# Patient Record
Sex: Female | Born: 1950
Health system: Southern US, Community
[De-identification: ages and names within clinical notes are randomized; demographics above are authoritative.]

## PROBLEM LIST (undated history)

## (undated) DIAGNOSIS — R002 Palpitations: Secondary | ICD-10-CM

## (undated) DIAGNOSIS — Z780 Asymptomatic menopausal state: Secondary | ICD-10-CM

## (undated) DIAGNOSIS — B009 Herpesviral infection, unspecified: Secondary | ICD-10-CM

## (undated) DIAGNOSIS — T7840XA Allergy, unspecified, initial encounter: Secondary | ICD-10-CM

## (undated) DIAGNOSIS — E119 Type 2 diabetes mellitus without complications: Secondary | ICD-10-CM

## (undated) DIAGNOSIS — K219 Gastro-esophageal reflux disease without esophagitis: Secondary | ICD-10-CM

## (undated) DIAGNOSIS — R7309 Other abnormal glucose: Secondary | ICD-10-CM

## (undated) DIAGNOSIS — M8580 Other specified disorders of bone density and structure, unspecified site: Secondary | ICD-10-CM

## (undated) HISTORY — DX: Gastro-esophageal reflux disease without esophagitis: K21.9

## (undated) HISTORY — DX: Herpesviral infection, unspecified: B00.9

## (undated) HISTORY — DX: Allergy, unspecified, initial encounter: T78.40XA

## (undated) HISTORY — DX: Palpitations: R00.2

## (undated) HISTORY — DX: Type 2 diabetes mellitus without complications: E11.9

## (undated) HISTORY — DX: Other abnormal glucose: R73.09

## (undated) HISTORY — DX: Asymptomatic menopausal state: Z78.0

## (undated) HISTORY — DX: Other specified disorders of bone density and structure, unspecified site: M85.80

## (undated) HISTORY — PX: CARDIAC ELECTROPHYSIOLOGY MAPPING AND ABLATION: SHX1292

## (undated) HISTORY — PX: COLONOSCOPY W/ POLYPECTOMY: SHX1380

---

## 2000-06-23 HISTORY — PX: OTHER SURGICAL HISTORY: SHX169

## 2000-06-23 LAB — HM COLONOSCOPY: HM Colonoscopy: NORMAL

## 2003-06-24 DIAGNOSIS — Z780 Asymptomatic menopausal state: Secondary | ICD-10-CM

## 2003-06-24 HISTORY — DX: Asymptomatic menopausal state: Z78.0

## 2005-11-27 ENCOUNTER — Encounter: Payer: Self-pay | Admitting: Internal Medicine

## 2006-04-15 ENCOUNTER — Ambulatory Visit: Payer: Self-pay | Admitting: Family Medicine

## 2006-05-01 ENCOUNTER — Ambulatory Visit: Payer: Self-pay | Admitting: Family Medicine

## 2006-05-01 LAB — CONVERTED CEMR LAB
ALT: 28 units/L (ref 0–40)
AST: 25 units/L (ref 0–37)
Chol/HDL Ratio, serum: 2.8
Cholesterol: 155 mg/dL (ref 0–200)
HDL: 55.2 mg/dL (ref >39.0)
LDL Cholesterol: 91 mg/dL (ref 0–99)
Triglyceride fasting, serum: 43 mg/dL (ref 0–149)
VLDL: 9 mg/dL (ref 0–40)

## 2006-06-10 ENCOUNTER — Ambulatory Visit: Payer: Self-pay | Admitting: Family Medicine

## 2006-07-20 ENCOUNTER — Ambulatory Visit: Payer: Self-pay | Admitting: Family Medicine

## 2006-08-12 ENCOUNTER — Ambulatory Visit: Payer: Self-pay | Admitting: Internal Medicine

## 2006-11-22 LAB — CONVERTED CEMR LAB: Pap Smear: NORMAL

## 2006-11-23 DIAGNOSIS — E785 Hyperlipidemia, unspecified: Secondary | ICD-10-CM | POA: Insufficient documentation

## 2006-11-26 ENCOUNTER — Other Ambulatory Visit: Admission: RE | Admit: 2006-11-26 | Discharge: 2006-11-26 | Payer: Self-pay | Admitting: Family Medicine

## 2006-11-26 ENCOUNTER — Ambulatory Visit: Payer: Self-pay | Admitting: Family Medicine

## 2006-11-26 ENCOUNTER — Encounter (INDEPENDENT_AMBULATORY_CARE_PROVIDER_SITE_OTHER): Payer: Self-pay | Admitting: Family Medicine

## 2006-11-26 DIAGNOSIS — B009 Herpesviral infection, unspecified: Secondary | ICD-10-CM | POA: Insufficient documentation

## 2006-11-27 ENCOUNTER — Telehealth (INDEPENDENT_AMBULATORY_CARE_PROVIDER_SITE_OTHER): Payer: Self-pay | Admitting: *Deleted

## 2006-11-27 LAB — CONVERTED CEMR LAB
Basophils Absolute: 0 10*3/uL (ref 0.0–0.1)
Basophils Relative: 0.5 % (ref 0.0–1.0)
Cholesterol: 191 mg/dL (ref 0–200)
Eosinophils Absolute: 0.3 10*3/uL (ref 0.0–0.6)
Eosinophils Relative: 4.5 % (ref 0.0–5.0)
Glucose, Bld: 87 mg/dL (ref 70–99)
HCT: 39.1 % (ref 36.0–46.0)
HDL: 62.4 mg/dL (ref >39.0)
Hemoglobin: 13 g/dL (ref 12.0–15.0)
LDL Cholesterol: 116 mg/dL — ABNORMAL HIGH (ref 0–99)
Lymphocytes Relative: 35.2 % (ref 12.0–46.0)
MCHC: 33.2 g/dL (ref 30.0–36.0)
MCV: 77.1 fL — ABNORMAL LOW (ref 78.0–100.0)
Monocytes Absolute: 0.6 10*3/uL (ref 0.2–0.7)
Monocytes Relative: 9.5 % (ref 3.0–11.0)
Neutro Abs: 2.9 10*3/uL (ref 1.4–7.7)
Neutrophils Relative %: 50.3 % (ref 43.0–77.0)
Platelets: 258 10*3/uL (ref 150–400)
RBC: 5.07 M/uL (ref 3.87–5.11)
RDW: 14.3 % (ref 11.5–14.6)
TSH: 1.94 microintl units/mL (ref 0.35–5.50)
Total CHOL/HDL Ratio: 3.1
Triglycerides: 64 mg/dL (ref 0–149)
VLDL: 13 mg/dL (ref 0–40)
WBC: 5.8 10*3/uL (ref 4.5–10.5)

## 2006-12-07 ENCOUNTER — Telehealth (INDEPENDENT_AMBULATORY_CARE_PROVIDER_SITE_OTHER): Payer: Self-pay | Admitting: *Deleted

## 2006-12-21 ENCOUNTER — Encounter: Admission: RE | Admit: 2006-12-21 | Discharge: 2006-12-21 | Payer: Self-pay | Admitting: Family Medicine

## 2007-09-02 ENCOUNTER — Ambulatory Visit: Payer: Self-pay | Admitting: Internal Medicine

## 2007-12-29 ENCOUNTER — Encounter: Admission: RE | Admit: 2007-12-29 | Discharge: 2007-12-29 | Payer: Self-pay | Admitting: Internal Medicine

## 2007-12-29 ENCOUNTER — Encounter: Payer: Self-pay | Admitting: Internal Medicine

## 2007-12-29 ENCOUNTER — Other Ambulatory Visit: Admission: RE | Admit: 2007-12-29 | Discharge: 2007-12-29 | Payer: Self-pay | Admitting: Internal Medicine

## 2007-12-29 ENCOUNTER — Ambulatory Visit: Payer: Self-pay | Admitting: Internal Medicine

## 2007-12-29 ENCOUNTER — Encounter (INDEPENDENT_AMBULATORY_CARE_PROVIDER_SITE_OTHER): Payer: Self-pay | Admitting: *Deleted

## 2007-12-29 DIAGNOSIS — M8580 Other specified disorders of bone density and structure, unspecified site: Secondary | ICD-10-CM | POA: Insufficient documentation

## 2007-12-29 LAB — CONVERTED CEMR LAB: Pap Smear: NORMAL

## 2007-12-30 ENCOUNTER — Encounter (INDEPENDENT_AMBULATORY_CARE_PROVIDER_SITE_OTHER): Payer: Self-pay | Admitting: *Deleted

## 2007-12-31 ENCOUNTER — Encounter (INDEPENDENT_AMBULATORY_CARE_PROVIDER_SITE_OTHER): Payer: Self-pay | Admitting: *Deleted

## 2007-12-31 LAB — CONVERTED CEMR LAB
ALT: 23 units/L (ref 0–35)
AST: 23 units/L (ref 0–37)
BUN: 12 mg/dL (ref 6–23)
Basophils Absolute: 0 10*3/uL (ref 0.0–0.1)
Basophils Relative: 0.5 % (ref 0.0–1.0)
CO2: 28 meq/L (ref 19–32)
Calcium: 9.6 mg/dL (ref 8.4–10.5)
Chloride: 106 meq/L (ref 96–112)
Cholesterol: 208 mg/dL (ref 0–200)
Creatinine, Ser: 0.8 mg/dL (ref 0.4–1.2)
Direct LDL: 132.8 mg/dL
Eosinophils Absolute: 0.2 10*3/uL (ref 0.0–0.7)
Eosinophils Relative: 3.7 % (ref 0.0–5.0)
GFR calc Af Amer: 95 mL/min
GFR calc non Af Amer: 79 mL/min
Glucose, Bld: 91 mg/dL (ref 70–99)
HCT: 40.4 % (ref 36.0–46.0)
HDL: 54.9 mg/dL (ref >39.0)
Hemoglobin: 12.9 g/dL (ref 12.0–15.0)
Lymphocytes Relative: 28 % (ref 12.0–46.0)
MCHC: 32 g/dL (ref 30.0–36.0)
MCV: 79.6 fL (ref 78.0–100.0)
Monocytes Absolute: 0.7 10*3/uL (ref 0.1–1.0)
Monocytes Relative: 10.1 % (ref 3.0–12.0)
Neutro Abs: 3.8 10*3/uL (ref 1.4–7.7)
Neutrophils Relative %: 57.7 % (ref 43.0–77.0)
Platelets: 226 10*3/uL (ref 150–400)
Potassium: 4.4 meq/L (ref 3.5–5.1)
RBC: 5.08 M/uL (ref 3.87–5.11)
RDW: 13.7 % (ref 11.5–14.6)
Sodium: 140 meq/L (ref 135–145)
TSH: 1.19 microintl units/mL (ref 0.35–5.50)
Total CHOL/HDL Ratio: 3.8
Triglycerides: 51 mg/dL (ref 0–149)
VLDL: 10 mg/dL (ref 0–40)
WBC: 6.5 10*3/uL (ref 4.5–10.5)

## 2008-01-18 ENCOUNTER — Encounter (INDEPENDENT_AMBULATORY_CARE_PROVIDER_SITE_OTHER): Payer: Self-pay | Admitting: *Deleted

## 2008-01-24 ENCOUNTER — Encounter (INDEPENDENT_AMBULATORY_CARE_PROVIDER_SITE_OTHER): Payer: Self-pay | Admitting: *Deleted

## 2008-09-05 ENCOUNTER — Telehealth (INDEPENDENT_AMBULATORY_CARE_PROVIDER_SITE_OTHER): Payer: Self-pay | Admitting: *Deleted

## 2008-12-28 ENCOUNTER — Telehealth (INDEPENDENT_AMBULATORY_CARE_PROVIDER_SITE_OTHER): Payer: Self-pay | Admitting: *Deleted

## 2009-01-22 ENCOUNTER — Encounter: Payer: Self-pay | Admitting: Internal Medicine

## 2009-01-23 ENCOUNTER — Other Ambulatory Visit: Admission: RE | Admit: 2009-01-23 | Discharge: 2009-01-23 | Payer: Self-pay | Admitting: Internal Medicine

## 2009-01-23 ENCOUNTER — Ambulatory Visit: Payer: Self-pay | Admitting: Internal Medicine

## 2009-01-23 ENCOUNTER — Encounter: Payer: Self-pay | Admitting: Internal Medicine

## 2009-01-23 DIAGNOSIS — J309 Allergic rhinitis, unspecified: Secondary | ICD-10-CM | POA: Insufficient documentation

## 2009-01-24 ENCOUNTER — Encounter: Payer: Self-pay | Admitting: Internal Medicine

## 2009-01-24 LAB — CONVERTED CEMR LAB
ALT: 26 units/L (ref 0–35)
AST: 25 units/L (ref 0–37)
Albumin: 3.6 g/dL (ref 3.5–5.2)
Alkaline Phosphatase: 39 units/L (ref 39–117)
BUN: 10 mg/dL (ref 6–23)
Basophils Absolute: 0 10*3/uL (ref 0.0–0.1)
Basophils Relative: 0.6 % (ref 0.0–3.0)
Bilirubin, Direct: 0.1 mg/dL (ref 0.0–0.3)
CO2: 25 meq/L (ref 19–32)
Calcium: 9.3 mg/dL (ref 8.4–10.5)
Chloride: 111 meq/L (ref 96–112)
Cholesterol: 213 mg/dL — ABNORMAL HIGH (ref 0–200)
Creatinine, Ser: 0.9 mg/dL (ref 0.4–1.2)
Direct LDL: 140.9 mg/dL
Eosinophils Absolute: 0.2 10*3/uL (ref 0.0–0.7)
Eosinophils Relative: 4.2 % (ref 0.0–5.0)
GFR calc non Af Amer: 82.66 mL/min (ref >60)
Glucose, Bld: 91 mg/dL (ref 70–99)
HCT: 39.8 % (ref 36.0–46.0)
HDL: 59.1 mg/dL (ref >39.00)
Hemoglobin: 12.5 g/dL (ref 12.0–15.0)
Lymphocytes Relative: 39.8 % (ref 12.0–46.0)
Lymphs Abs: 2.1 10*3/uL (ref 0.7–4.0)
MCHC: 31.5 g/dL (ref 30.0–36.0)
MCV: 80.6 fL (ref 78.0–100.0)
Monocytes Absolute: 0.7 10*3/uL (ref 0.1–1.0)
Monocytes Relative: 12.3 % — ABNORMAL HIGH (ref 3.0–12.0)
Neutro Abs: 2.4 10*3/uL (ref 1.4–7.7)
Neutrophils Relative %: 43.1 % (ref 43.0–77.0)
Platelets: 215 10*3/uL (ref 150.0–400.0)
Potassium: 4.2 meq/L (ref 3.5–5.1)
RBC: 4.94 M/uL (ref 3.87–5.11)
RDW: 13.5 % (ref 11.5–14.6)
Sodium: 143 meq/L (ref 135–145)
TSH: 2.47 microintl units/mL (ref 0.35–5.50)
Total Bilirubin: 0.7 mg/dL (ref 0.3–1.2)
Total CHOL/HDL Ratio: 4
Total Protein: 7.5 g/dL (ref 6.0–8.3)
Triglycerides: 49 mg/dL (ref 0.0–149.0)
VLDL: 9.8 mg/dL (ref 0.0–40.0)
WBC: 5.4 10*3/uL (ref 4.5–10.5)

## 2009-02-02 LAB — CONVERTED CEMR LAB: Vit D, 25-Hydroxy: 21 ng/mL — ABNORMAL LOW (ref 30–89)

## 2009-02-06 ENCOUNTER — Encounter: Payer: Self-pay | Admitting: Internal Medicine

## 2009-02-06 ENCOUNTER — Encounter: Admission: RE | Admit: 2009-02-06 | Discharge: 2009-02-06 | Payer: Self-pay | Admitting: Internal Medicine

## 2009-02-06 ENCOUNTER — Encounter (INDEPENDENT_AMBULATORY_CARE_PROVIDER_SITE_OTHER): Payer: Self-pay | Admitting: *Deleted

## 2009-02-14 ENCOUNTER — Encounter: Admission: RE | Admit: 2009-02-14 | Discharge: 2009-02-14 | Payer: Self-pay | Admitting: Internal Medicine

## 2009-02-20 ENCOUNTER — Encounter (INDEPENDENT_AMBULATORY_CARE_PROVIDER_SITE_OTHER): Payer: Self-pay | Admitting: *Deleted

## 2009-07-13 ENCOUNTER — Telehealth (INDEPENDENT_AMBULATORY_CARE_PROVIDER_SITE_OTHER): Payer: Self-pay | Admitting: *Deleted

## 2009-12-03 ENCOUNTER — Encounter: Payer: Self-pay | Admitting: Internal Medicine

## 2010-01-23 ENCOUNTER — Ambulatory Visit: Payer: Self-pay | Admitting: Internal Medicine

## 2010-01-23 ENCOUNTER — Other Ambulatory Visit: Admission: RE | Admit: 2010-01-23 | Discharge: 2010-01-23 | Payer: Self-pay | Admitting: Internal Medicine

## 2010-01-23 DIAGNOSIS — R739 Hyperglycemia, unspecified: Secondary | ICD-10-CM | POA: Insufficient documentation

## 2010-01-23 DIAGNOSIS — R7309 Other abnormal glucose: Secondary | ICD-10-CM

## 2010-01-23 DIAGNOSIS — R7303 Prediabetes: Secondary | ICD-10-CM

## 2010-01-23 HISTORY — DX: Other abnormal glucose: R73.09

## 2010-01-23 LAB — CONVERTED CEMR LAB
Ferritin: 358.6 ng/mL — ABNORMAL HIGH (ref 10.0–291.0)
Hgb A1c MFr Bld: 6.2 % (ref 4.6–6.5)
Iron: 39 ug/dL — ABNORMAL LOW (ref 42–145)
Vit D, 25-Hydroxy: 40 ng/mL (ref 30–89)

## 2010-01-23 LAB — HM PAP SMEAR

## 2010-01-28 LAB — CONVERTED CEMR LAB: Pap Smear: NEGATIVE

## 2010-01-29 LAB — CONVERTED CEMR LAB
ALT: 25 units/L (ref 0–35)
AST: 24 units/L (ref 0–37)
Albumin: 3.7 g/dL (ref 3.5–5.2)
Alkaline Phosphatase: 37 units/L — ABNORMAL LOW (ref 39–117)
BUN: 8 mg/dL (ref 6–23)
Basophils Absolute: 0 10*3/uL (ref 0.0–0.1)
Basophils Relative: 0.6 % (ref 0.0–3.0)
Bilirubin, Direct: 0.1 mg/dL (ref 0.0–0.3)
CO2: 26 meq/L (ref 19–32)
Calcium: 9.6 mg/dL (ref 8.4–10.5)
Chloride: 103 meq/L (ref 96–112)
Cholesterol: 203 mg/dL — ABNORMAL HIGH (ref 0–200)
Creatinine, Ser: 0.8 mg/dL (ref 0.4–1.2)
Direct LDL: 126.5 mg/dL
Eosinophils Absolute: 0.2 10*3/uL (ref 0.0–0.7)
Eosinophils Relative: 3.7 % (ref 0.0–5.0)
GFR calc non Af Amer: 90.45 mL/min (ref >60)
Glucose, Bld: 98 mg/dL (ref 70–99)
HCT: 38.1 % (ref 36.0–46.0)
HDL: 63.9 mg/dL (ref >39.00)
Hemoglobin: 12.3 g/dL (ref 12.0–15.0)
Lymphocytes Relative: 24.4 % (ref 12.0–46.0)
Lymphs Abs: 1.5 10*3/uL (ref 0.7–4.0)
MCHC: 32.2 g/dL (ref 30.0–36.0)
MCV: 79.7 fL (ref 78.0–100.0)
Monocytes Absolute: 0.6 10*3/uL (ref 0.1–1.0)
Monocytes Relative: 10.3 % (ref 3.0–12.0)
Neutro Abs: 3.8 10*3/uL (ref 1.4–7.7)
Neutrophils Relative %: 61 % (ref 43.0–77.0)
Platelets: 232 10*3/uL (ref 150.0–400.0)
Potassium: 4.4 meq/L (ref 3.5–5.1)
RBC: 4.78 M/uL (ref 3.87–5.11)
RDW: 15.5 % — ABNORMAL HIGH (ref 11.5–14.6)
Sodium: 141 meq/L (ref 135–145)
TSH: 1.97 microintl units/mL (ref 0.35–5.50)
Total Bilirubin: 0.5 mg/dL (ref 0.3–1.2)
Total CHOL/HDL Ratio: 3
Total Protein: 7.2 g/dL (ref 6.0–8.3)
Triglycerides: 46 mg/dL (ref 0.0–149.0)
VLDL: 9.2 mg/dL (ref 0.0–40.0)
WBC: 6.2 10*3/uL (ref 4.5–10.5)

## 2010-02-20 ENCOUNTER — Encounter: Admission: RE | Admit: 2010-02-20 | Discharge: 2010-02-20 | Payer: Self-pay | Admitting: Internal Medicine

## 2010-02-20 LAB — HM MAMMOGRAPHY

## 2010-02-21 ENCOUNTER — Telehealth (INDEPENDENT_AMBULATORY_CARE_PROVIDER_SITE_OTHER): Payer: Self-pay | Admitting: *Deleted

## 2010-06-06 ENCOUNTER — Ambulatory Visit: Payer: Self-pay | Admitting: Internal Medicine

## 2010-07-23 NOTE — Letter (Signed)
Summary: Results Follow up Letter  [facility] [place]  [address] [place], [place] [address]   Phone: [phone]  Fax: [phone]    12/31/2007 MRN: [id number]  Alyssa Murray [address] [place], [place]  [address]  Dear Ms. Alyssa Murray,  The following are the results of your recent test(s):  Test         Result    Pap Smear:        Normal _____  Not Normal _____ Comments: ______________________________________________________ Cholesterol: LDL(Bad cholesterol):         Your goal is less than:         HDL (Good cholesterol):       Your goal is more than: Comments:  ______________________________________________________ Mammogram:        Normal ___x__  Not Normal _____ Comments:  __________Next mammogram due in 1 year _________________________________________________________ Hemoccult:        Normal _____  Not normal _______ Comments:    _____________________________________________________________________ Other Tests:  see attached copy of labs and comments  We routinely do not discuss normal results over the telephone.  If you desire a copy of the results, or you have any questions about this information we can discuss them at your next office visit.   Sincerely,

## 2010-07-23 NOTE — Assessment & Plan Note (Signed)
Summary: CPX   Vital Signs:  Patient profile:   60 year old female Height:      66.5 inches Weight:      152 pounds BMI:     24.25 Pulse rate:   88 / minute Pulse rhythm:   regular BP sitting:   118 / 76  (left arm) Cuff size:   large  Vitals Entered By: Army Fossa CMA (January 23, 2010 8:03 AM) CC: CPX: Fasting   History of Present Illness: CPX c/o pain at L trapezoid   Current Medications (verified): 1)  Fosamax 70 Mg  Tabs (Alendronate Sodium) .Marland Kitchen.. 1 By Mouth Qwk 2)  Valtrex 500 Mg  Tabs (Valacyclovir Hcl) .Marland Kitchen.. 1 By Mouth Once Daily Two Times A Day X 3 Days 3)  Ca and Vit D 4)  Mvi .... Daily 5)  Cod Liver Oil  Caps (Cod Liver Oil) 6)  Ferrous Gluconate 240 (27 Fe) Mg Tabs (Ferrous Gluconate) 7)  Ginkgo Biloba 60 Mg Tabs (Ginkgo Biloba) 8)  Garlic-Parsley  Tabs (Garlic-Parsley)  Allergies (verified): No Known Drug Allergies  Past History:  Past Medical History: Reviewed history from 01/23/2009 and no changes required. HSV- L buttocks Osteopenia Cardiac ablation secondary to palpitation done aprox 2000--now Asx Allergic rhinitis menopause (since aprox 2005)  Past Surgical History: Reviewed history from 01/23/2009 and no changes required. 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 Single 2 children , they are in [place] the other in [place] Never Smoked Alcohol use-yes Drug use-no diet-- healthy  exercise-- daily  exercise-- daily  Regular exercise-yes  Review of Systems CV:  Denies chest pain or discomfort, palpitations, and swelling of feet. Resp:  Denies cough and wheezing. GI:  Denies bloody stools, diarrhea, nausea, and vomiting. GU:  does SBE, normal  no vag  d/c or bleed. Psych:  Denies anxiety and depression.  Physical Exam  General:  alert, well-developed, and well-nourished.   Neck:  no masses, no thyromegaly, and normal carotid upstroke.   Breasts:  No mass, nodules, thickening, tenderness, bulging, retraction, inflamation, nipple discharge or skin changes noted.  No axilary LADs Lungs:  normal respiratory effort, no intercostal retractions, no accessory muscle use, and normal breath sounds.   Heart:  normal rate, regular rhythm, and no murmur.   Abdomen:  soft, non-tender, no distention, no masses, no guarding, and no rigidity.   Genitalia:  normal introitus, no external lesions, no vaginal discharge, mucosa pink and moist, no vaginal or cervical lesions, no vaginal atrophy, no friaility or hemorrhage, normal uterus size and position, and no adnexal masses or tenderness.   Extremities:  no pretibial edema bilaterally  Psych:  Oriented X3, memory intact for recent and remote, normally interactive, good eye contact, not anxious appearing, and not depressed appearing.     Impression & Recommendations:  Problem # 1:  HEALTH SCREENING (ICD-V70.0) Td 2008 dieting better, exercising, loosing wt ---> praised   Colonoscopy History:  06/23/2000, in [place] as a part of her health plan (had no sx) , reportedly normal hemocult neg today, no FH colon ca next Cscope 2012  Mammogram   8-10  normal , planning to have one this year PAP  today  DEXA normal 01-2009     Orders: Venipuncture (81191) TLB-CBC Platelet - w/Differential (85025-CBCD) TLB-Lipid Panel (80061-LIPID) Specimen Handling (47829) TLB-Iron, (Fe) Total (83540-FE) TLB-A1C / Hgb A1C (Glycohemoglobin) (83036-A1C) TLB-Ferritin (82728-FER) T-Vitamin D (25-Hydroxy) ([address]-08657)  Problem # 2:  trapezoid pain patient has discomfort to palpation medially from the left shoulder blade, muscles feel tight Plan: Advil,  Flexeril, heat, if not better she will contact Dr. Christell Faith  Problem #  3:  HYPERGLYCEMIA, BORDERLINE (ICD-790.29) A1c few months ago at the cardiology office was 6.0 This was discussed with the patient, she has borderline diabetes advised  to continue with her healthy lifestyle  recheck a A1c Followup in 4 months  Complete Medication List: 1)  Fosamax 70 Mg Tabs (Alendronate sodium) .Marland Kitchen.. 1 by mouth qwk 2)  Valtrex 500 Mg Tabs (Valacyclovir hcl) .Marland Kitchen.. 1 by mouth once daily two times a day x 3 days 3)  Ca and Vit D  4)  Mvi  .... Daily 5)  Cod Liver Oil Caps (Cod liver oil) 6)  Ferrous Gluconate 240 (27 Fe) Mg Tabs (Ferrous gluconate) 7)  Ginkgo Biloba 60 Mg Tabs (Ginkgo biloba) 8)  Garlic-parsley Tabs (Garlic-parsley) 9)  Flexeril 10 Mg Tabs (Cyclobenzaprine hcl) .Marland Kitchen.. 1 by mouth at bedtime as needed pain  Patient Instructions: 1)  Please schedule a follow-up appointment in 4 months 2)  Take 400  mg of Ibuprofen (Advil, Motrin) with food every 6 hours as needed  for relief of pain , watch for stomach irritation 3)  flexeril at night as needed (somnolence) Prescriptions: FLEXERIL 10 MG TABS (CYCLOBENZAPRINE HCL) 1 by mouth at bedtime as needed pain  #21 x 0   Entered and Authorized by:   Nolon Rod. Paz MD   Signed by:   Nolon Rod. Paz MD on 01/23/2010   Method used:   Print then Give to Patient   RxID:   [id number]

## 2010-07-23 NOTE — Progress Notes (Signed)
Summary: refill request  Phone Note Refill Request Call back at [phone] Message from:  Pharmacy on February 21, 2010 2:38 PM  Refills Requested: Medication #1:  FOSAMAX 70 MG  TABS 1 by mouth qwk   Dosage confirmed as above?Dosage Confirmed   Supply Requested: 1 month   Last Refilled: 01/16/2010 [facility]  Next Appointment Scheduled: 06/06/10 Initial call taken by: Lavell Islam,  February 21, 2010 2:39 PM    Prescriptions: FOSAMAX 70 MG  TABS (ALENDRONATE SODIUM) 1 by mouth qwk  #4 x 3   Entered by:   Army Fossa CMA   Authorized by:   Nolon Rod. Paz MD   Signed by:   Army Fossa CMA on 02/22/2010   Method used:   Faxed to ...       [facility] (retail)       [address] [place]       [address]       [place], [place]  [address]       Ph: [phone]       Fax: [phone]   RxID:   [id number]

## 2010-07-23 NOTE — Letter (Signed)
Summary: cardiology followup    Alvera Singh MD  Alvera Singh MD   Imported By: Lanelle Bal 12/26/2009 15:27:00  _____________________________________________________________________  External Attachment:    Type:   Image     Comment:   External Document

## 2010-07-23 NOTE — Progress Notes (Signed)
Summary: vitamind D refill DENIED  Phone Note Refill Request Message from:  Fax from Pharmacy on [facility] FAX [phone]  VITAMIN D 50000IU CAP #12  Initial call taken by: Barb Merino,  July 13, 2009 12:57 PM  Follow-up for Phone Call        DENIED, patient was only to take for 3 months.  Patient should be on OTC vitamin D & calcium now. Follow-up by: Shary Decamp,  July 13, 2009 3:17 PM

## 2010-08-14 ENCOUNTER — Telehealth: Payer: Self-pay | Admitting: Internal Medicine

## 2010-08-20 NOTE — Progress Notes (Signed)
Summary: med refill  Phone Note Refill Request Message from:  Fax from Pharmacy on August 14, 2010 12:55 PM  Refills Requested: Medication #1:  FOSAMAX 70 MG  TABS 1 by mouth qwk   Last Refilled: 02/22/2010   Notes: [facility] Next Appointment Scheduled: none Initial call taken by: Lucious Groves CMA,  August 14, 2010 12:55 PM    Prescriptions: FOSAMAX 70 MG  TABS (ALENDRONATE SODIUM) 1 by mouth qwk  #4 x 1   Entered by:   Army Fossa CMA   Authorized by:   Nolon Rod. Javarion Douty MD   Signed by:   Army Fossa CMA on 08/14/2010   Method used:   Faxed to ...       [facility] (retail)       [address] [place]       [address]       [place], [place]  [address]       Ph: [phone]       Fax: [phone]   RxID:   [id number]

## 2010-09-10 ENCOUNTER — Other Ambulatory Visit: Payer: Self-pay | Admitting: Internal Medicine

## 2010-09-10 MED ORDER — VALACYCLOVIR HCL 500 MG PO TABS: 500.0000 mg | ORAL_TABLET | Freq: Two times a day (BID) | ORAL | Status: AC

## 2010-09-17 ENCOUNTER — Encounter: Payer: Self-pay | Admitting: Internal Medicine

## 2010-09-18 ENCOUNTER — Ambulatory Visit (INDEPENDENT_AMBULATORY_CARE_PROVIDER_SITE_OTHER): Payer: 59 | Admitting: Internal Medicine

## 2010-09-18 ENCOUNTER — Encounter: Payer: Self-pay | Admitting: Internal Medicine

## 2010-09-18 VITALS — BP 122/82 | HR 74 | Wt 161.2 lb

## 2010-09-18 DIAGNOSIS — L259 Unspecified contact dermatitis, unspecified cause: Secondary | ICD-10-CM

## 2010-09-18 DIAGNOSIS — M25569 Pain in unspecified knee: Secondary | ICD-10-CM

## 2010-09-18 DIAGNOSIS — M899 Disorder of bone, unspecified: Secondary | ICD-10-CM

## 2010-09-18 DIAGNOSIS — M949 Disorder of cartilage, unspecified: Secondary | ICD-10-CM

## 2010-09-18 MED ORDER — CLOTRIMAZOLE-BETAMETHASONE 1-0.05 % EX CREA: TOPICAL_CREAM | Freq: Two times a day (BID) | CUTANEOUS | Status: DC

## 2010-09-18 NOTE — Progress Notes (Signed)
  Subjective:    Patient ID: Alyssa Murray, female    DOB: [birth year], 60 y.o.   MRN: [id number]  HPI Long history of moderate knee pain bilaterally without swelling or warmness  The patient is very active Sometimes her ankles "pop" Wonders if symptoms are related to Fosamax  She also developed a rash in both groins. The rash was red and very itchy. No blisters. She thought she had herpes. Rash is getting  slightly better.   Review of Systems  Mild heartburn, no dysphasia or odynophagia Denies any pain in  other joints like her wrists and hands  Past Medical History  Diagnosis Date  . HSV infection     L buttocks  . Osteopenia   . Palpitation     cardiac ablation secondary to palptiation done aprox 2000- now Asx  . Allergic rhinitis   . Menopause 2005    Past Surgical History  Procedure Date  . 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 No lower extremity edema Hands and wrists normal to inspection and palpation Skin: Mild redness without edema at both inner tights close to the groin.       Assessment & Plan:  --Dermatitis on the inner groin, don't think he is herpes, likely a fungal infection. See cream prescription, will call if no better --Knee pain and ankles "popping", physical exam is negative, she may be having early DJD. Nevertheless, the pain is not severe, she doesn't take any medication so I  recommend her to continue being active.

## 2010-09-18 NOTE — Patient Instructions (Signed)
Call if redness in the groin gets worse

## 2010-09-18 NOTE — Assessment & Plan Note (Addendum)
Long history of osteopenia, reportedly had a borderline bone density test  ~2001, was prescribed Fosamax for prevention. Last bone density test here was 02-06-09, normal. She presents today with knee pain, I don't believe pain is related to Fosamax. Nevertheless, she has been on Fosamax for years so we'll discontinue it today. Continue with being active, exercise and take calcium and vitamin D. Next bone density test 2 years from today

## 2010-11-08 NOTE — Assessment & Plan Note (Signed)
[facility]                        [place] OFFICE NOTE   Alyssa Murray, Alyssa Murray                          MRN:          [id number]  DATE:07/20/2006                            DOB:          1951-02-18    REASON FOR VISIT:  Followup culture.   Alyssa Murray is a 60 year old female who I saw mid December for her  regular followup.  At that point, she had a lesion on her left buttock  that I cultured.  She is here to discuss results.  She has been made  aware that herpes simplex virus was isolated.  She is here to discuss  treatment.  She has no other specific complaints.   MEDICATIONS:  1. Fosamax 70 mg weekly.  2. Lipitor 10 daily.   OBJECTIVE:  No examination was performed today.   IMPRESSION:  Herpes simplex virus infection.   PLAN:  1. Pathophysiology of the virus was discussed.  I reviewed treatment      options including suppressive and acute therapy.  At this point,      the patient has not decided.  Reviewed reasoning's for above      options.  The patient was indecisive on the approach she would take      and, thus, a prescription for Valtrex 500 mg daily, #30 with 3      refills was given.  2. I also provided samples of Lipitor 20 mg to take 1/2 a pill daily.      The patient to follow up when due for her complete physical      examination and as needed in the interim.     Leanne Chang, M.D.  Electronically Signed    LA/MedQ  DD: 07/20/2006  DT: 07/20/2006  Job #: [id number]

## 2011-02-14 ENCOUNTER — Other Ambulatory Visit: Payer: Self-pay | Admitting: Internal Medicine

## 2011-02-14 DIAGNOSIS — Z1231 Encounter for screening mammogram for malignant neoplasm of breast: Secondary | ICD-10-CM

## 2011-03-03 ENCOUNTER — Ambulatory Visit
Admission: RE | Admit: 2011-03-03 | Discharge: 2011-03-03 | Disposition: A | Discharge status: Home / Self Care | Payer: 59 | Source: Ambulatory Visit | Attending: Internal Medicine | Admitting: Internal Medicine

## 2011-03-03 ENCOUNTER — Ambulatory Visit (INDEPENDENT_AMBULATORY_CARE_PROVIDER_SITE_OTHER): Payer: 59 | Admitting: Internal Medicine

## 2011-03-03 ENCOUNTER — Encounter: Payer: Self-pay | Admitting: Internal Medicine

## 2011-03-03 DIAGNOSIS — M949 Disorder of cartilage, unspecified: Secondary | ICD-10-CM

## 2011-03-03 DIAGNOSIS — R7309 Other abnormal glucose: Secondary | ICD-10-CM

## 2011-03-03 DIAGNOSIS — R739 Hyperglycemia, unspecified: Secondary | ICD-10-CM

## 2011-03-03 DIAGNOSIS — Z0000 Encounter for general adult medical examination without abnormal findings: Secondary | ICD-10-CM | POA: Insufficient documentation

## 2011-03-03 DIAGNOSIS — Z1231 Encounter for screening mammogram for malignant neoplasm of breast: Secondary | ICD-10-CM

## 2011-03-03 DIAGNOSIS — M899 Disorder of bone, unspecified: Secondary | ICD-10-CM

## 2011-03-03 LAB — CBC WITH DIFFERENTIAL/PLATELET
Basophils Absolute: 0 10*3/uL (ref 0.0–0.1)
Basophils Relative: 0.8 % (ref 0.0–3.0)
Eosinophils Absolute: 0.2 10*3/uL (ref 0.0–0.7)
Eosinophils Relative: 3.5 % (ref 0.0–5.0)
HCT: 41.6 % (ref 36.0–46.0)
Hemoglobin: 13.2 g/dL (ref 12.0–15.0)
Lymphocytes Relative: 31 % (ref 12.0–46.0)
Lymphs Abs: 1.8 10*3/uL (ref 0.7–4.0)
MCHC: 31.8 g/dL (ref 30.0–36.0)
MCV: 79.9 fl (ref 78.0–100.0)
Monocytes Absolute: 0.5 10*3/uL (ref 0.1–1.0)
Monocytes Relative: 8.6 % (ref 3.0–12.0)
Neutro Abs: 3.2 10*3/uL (ref 1.4–7.7)
Neutrophils Relative %: 56.1 % (ref 43.0–77.0)
Platelets: 260 10*3/uL (ref 150.0–400.0)
RBC: 5.2 Mil/uL — ABNORMAL HIGH (ref 3.87–5.11)
RDW: 14.6 % (ref 11.5–14.6)
WBC: 5.7 10*3/uL (ref 4.5–10.5)

## 2011-03-03 LAB — COMPREHENSIVE METABOLIC PANEL
ALT: 21 U/L (ref 0–35)
AST: 23 U/L (ref 0–37)
Albumin: 3.9 g/dL (ref 3.5–5.2)
Alkaline Phosphatase: 42 U/L (ref 39–117)
BUN: 12 mg/dL (ref 6–23)
CO2: 26 mEq/L (ref 19–32)
Calcium: 9.8 mg/dL (ref 8.4–10.5)
Chloride: 106 mEq/L (ref 96–112)
Creatinine, Ser: 0.9 mg/dL (ref 0.4–1.2)
GFR: 80.01 mL/min (ref >60.00)
Glucose, Bld: 91 mg/dL (ref 70–99)
Potassium: 3.9 mEq/L (ref 3.5–5.1)
Sodium: 140 mEq/L (ref 135–145)
Total Bilirubin: 0.5 mg/dL (ref 0.3–1.2)
Total Protein: 7.8 g/dL (ref 6.0–8.3)

## 2011-03-03 LAB — LIPID PANEL
Cholesterol: 200 mg/dL (ref 0–200)
HDL: 68.6 mg/dL (ref >39.00)
LDL Cholesterol: 121 mg/dL — ABNORMAL HIGH (ref 0–99)
Total CHOL/HDL Ratio: 3
Triglycerides: 51 mg/dL (ref 0.0–149.0)
VLDL: 10.2 mg/dL (ref 0.0–40.0)

## 2011-03-03 LAB — HEMOGLOBIN A1C: Hgb A1c MFr Bld: 6.3 % (ref 4.6–6.5)

## 2011-03-03 NOTE — Assessment & Plan Note (Signed)
Labs today She will RTC in 1 year, consider re-check a A1C in 6 months

## 2011-03-03 NOTE — Assessment & Plan Note (Signed)
Long history of osteopenia, reportedly had a borderline bone density test ~2001, was prescribed Fosamax for prevention.  Last bone density test here was 02-06-09, normal.  We d/c  Fosamax 08-2010 (holyday)  RX: exercise and take calcium and vitamin D.  Next bone density test 3-14

## 2011-03-03 NOTE — Assessment & Plan Note (Addendum)
Td 2008 zostavax discussed  Counseled about diet-exercise  Colonoscopy History:  06/23/2000, in [place] as a part of her health plan (had no sx) , reportedly normal Will schedule a Cscope w/ [place]   Mammogram   8-11  normal , has one schedule for today Multiple PAPs before, never had an abnormal one, last PAP 8-11 neg, next 2013 or 2014  skin: has  A black head, reassured , call if redness or d/c

## 2011-03-03 NOTE — Progress Notes (Signed)
  Subjective:    Patient ID: Alyssa Murray, female    DOB: 10/08/1950, 60 y.o.   MRN: [id number]  HPI CPX Has a black head in the back, it has increased in size lately  Past Medical History  Diagnosis Date  . HSV infection     L buttocks  . Osteopenia   . Palpitation     cardiac ablation secondary to palptiation done aprox 2000- now Asx  . Allergic rhinitis   . Menopause 2005   Past Surgical History  Procedure Date  . 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 No dysuria or gross hematuria. She does self breast exam. It is normal.    Objective:   Physical Exam  Constitutional: She is oriented to person, place, and time. She appears well-developed and well-nourished. No distress.  HENT:  Head: Normocephalic and atraumatic.  Neck: No thyromegaly present.       Nl carotid pulse   Cardiovascular: Normal rate, regular rhythm and normal heart sounds.   No murmur heard. Pulmonary/Chest: Effort normal and breath sounds normal. No respiratory distress. She has no wheezes. She has no rales.  Abdominal: Soft. Bowel sounds are normal. She exhibits no distension. There is no tenderness. There is no rebound.  Genitourinary:       Breast exam: no dominant nodule, no axillary LADs  Musculoskeletal: She exhibits no edema.  Neurological: She is alert and oriented to person, place, and time.  Skin: Skin is warm and dry. She is not diaphoretic.       3/4  cm black head in the center of the mid-back  Psychiatric: She has a normal mood and affect. Her behavior is normal. Judgment and thought content normal.          Assessment & Plan:

## 2011-03-05 ENCOUNTER — Other Ambulatory Visit: Payer: Self-pay | Admitting: *Deleted

## 2011-03-05 ENCOUNTER — Encounter: Payer: Self-pay | Admitting: Internal Medicine

## 2011-03-05 DIAGNOSIS — R739 Hyperglycemia, unspecified: Secondary | ICD-10-CM

## 2011-03-06 ENCOUNTER — Other Ambulatory Visit: Payer: Self-pay | Admitting: Internal Medicine

## 2011-04-03 ENCOUNTER — Ambulatory Visit (AMBULATORY_SURGERY_CENTER): Payer: 59 | Admitting: *Deleted

## 2011-04-03 VITALS — Ht 67.0 in | Wt 161.1 lb

## 2011-04-03 DIAGNOSIS — Z1211 Encounter for screening for malignant neoplasm of colon: Secondary | ICD-10-CM

## 2011-04-03 MED ORDER — PEG-KCL-NACL-NASULF-NA ASC-C 100 G PO SOLR: ORAL | Status: DC

## 2011-04-08 ENCOUNTER — Ambulatory Visit (AMBULATORY_SURGERY_CENTER): Payer: 59 | Admitting: Internal Medicine

## 2011-04-08 ENCOUNTER — Encounter: Payer: Self-pay | Admitting: Internal Medicine

## 2011-04-08 VITALS — BP 107/69 | HR 72 | Temp 97.3°F | Resp 20 | Ht 67.0 in | Wt 167.0 lb

## 2011-04-08 DIAGNOSIS — Z1211 Encounter for screening for malignant neoplasm of colon: Secondary | ICD-10-CM

## 2011-04-08 DIAGNOSIS — K5730 Diverticulosis of large intestine without perforation or abscess without bleeding: Secondary | ICD-10-CM

## 2011-04-08 DIAGNOSIS — D126 Benign neoplasm of colon, unspecified: Secondary | ICD-10-CM

## 2011-04-08 MED ORDER — SODIUM CHLORIDE 0.9 % IV SOLN: 500.0000 mL | INTRAVENOUS | Status: DC

## 2011-04-08 NOTE — Patient Instructions (Addendum)
There was a small polyp removed. It looks benign, I will have it analyzed by a pathologist and let you know the findings and plans for next routine colonoscopy.  You also have diverticulosis. Please read the information provided. Iva Boop, MD, [facility]  Please refer to your blue and neon green sheets for instructions regarding diet and activity for the rest of today.  You may resume your medications as you would normally take them.   Diverticulosis Diverticulosis is a common condition that develops when small pouches (diverticula) form in the wall of the colon. The risk of diverticulosis increases with age. It happens more often in people who eat a low-fiber diet. Most individuals with diverticulosis have no symptoms. Those individuals with symptoms usually experience belly (abdominal) pain, constipation, or loose stools (diarrhea). HOME CARE INSTRUCTIONS  Increase the amount of fiber in your diet as directed by your caregiver or dietician. This may reduce symptoms of diverticulosis.   Your caregiver may recommend taking a dietary fiber supplement.   Drink at least 6 to 8 glasses of water each day to prevent constipation.   Try not to strain when you have a bowel movement.   Your caregiver may recommend avoiding nuts and seeds to prevent complications, although this is still an uncertain benefit.   Only take over-the-counter or prescription medicines for pain, discomfort, or fever as directed by your caregiver.  FOODS HAVING HIGH FIBER CONTENT INCLUDE:  Fruits. Apple, peach, pear, tangerine, raisins, prunes.   Vegetables. Brussels sprouts, asparagus, broccoli, cabbage, carrot, cauliflower, romaine lettuce, spinach, summer squash, tomato, winter squash, zucchini.   Starchy Vegetables. Baked beans, kidney beans, lima beans, split peas, lentils, potatoes (with skin).   Grains. Whole wheat bread, brown rice, bran flake cereal, plain oatmeal, white rice, shredded wheat, bran muffins.    SEEK IMMEDIATE MEDICAL CARE IF:  You develop increasing pain or severe bloating.   You have an oral temperature above 101.3, not controlled by medicine.   You develop vomiting or bowel movements that are bloody or black.  Document Released: 03/06/2004 Document Re-Released: 11/27/2009 [facility] Patient Information 2011 [place], [place].

## 2011-04-09 ENCOUNTER — Telehealth: Payer: Self-pay | Admitting: *Deleted

## 2011-04-09 NOTE — Telephone Encounter (Signed)

## 2011-04-10 ENCOUNTER — Other Ambulatory Visit: Payer: Self-pay | Admitting: Internal Medicine

## 2011-04-10 NOTE — Telephone Encounter (Signed)
Done

## 2011-04-15 ENCOUNTER — Encounter: Payer: Self-pay | Admitting: Internal Medicine

## 2011-04-15 NOTE — Progress Notes (Signed)
Quick Note:  Hyperplastic polyp Diverticulosis  Repeat colonoscopy 10 years 2022 ______

## 2011-11-24 ENCOUNTER — Ambulatory Visit (INDEPENDENT_AMBULATORY_CARE_PROVIDER_SITE_OTHER): Payer: PRIVATE HEALTH INSURANCE | Admitting: *Deleted

## 2011-11-24 DIAGNOSIS — Z23 Encounter for immunization: Secondary | ICD-10-CM

## 2011-11-24 DIAGNOSIS — Z2911 Encounter for prophylactic immunotherapy for respiratory syncytial virus (RSV): Secondary | ICD-10-CM

## 2012-02-13 ENCOUNTER — Other Ambulatory Visit: Payer: Self-pay | Admitting: Internal Medicine

## 2012-02-13 DIAGNOSIS — Z1231 Encounter for screening mammogram for malignant neoplasm of breast: Secondary | ICD-10-CM

## 2012-03-05 ENCOUNTER — Encounter: Payer: PRIVATE HEALTH INSURANCE | Admitting: Internal Medicine

## 2012-03-15 ENCOUNTER — Ambulatory Visit
Admission: RE | Admit: 2012-03-15 | Discharge: 2012-03-15 | Disposition: A | Discharge status: Home / Self Care | Payer: PRIVATE HEALTH INSURANCE | Source: Ambulatory Visit | Attending: Internal Medicine | Admitting: Internal Medicine

## 2012-03-15 DIAGNOSIS — Z1231 Encounter for screening mammogram for malignant neoplasm of breast: Secondary | ICD-10-CM

## 2012-03-23 HISTORY — PX: MOUTH SURGERY: SHX715

## 2012-04-20 ENCOUNTER — Ambulatory Visit (INDEPENDENT_AMBULATORY_CARE_PROVIDER_SITE_OTHER): Payer: PRIVATE HEALTH INSURANCE | Admitting: Internal Medicine

## 2012-04-20 ENCOUNTER — Encounter: Payer: Self-pay | Admitting: Internal Medicine

## 2012-04-20 ENCOUNTER — Other Ambulatory Visit (HOSPITAL_COMMUNITY)
Admission: RE | Admit: 2012-04-20 | Discharge: 2012-04-20 | Disposition: A | Discharge status: Home / Self Care | Payer: PRIVATE HEALTH INSURANCE | Source: Ambulatory Visit | Attending: Internal Medicine | Admitting: Internal Medicine

## 2012-04-20 VITALS — BP 116/84 | HR 70 | Temp 98.0°F | Ht 66.25 in | Wt 153.0 lb

## 2012-04-20 DIAGNOSIS — M899 Disorder of bone, unspecified: Secondary | ICD-10-CM

## 2012-04-20 DIAGNOSIS — M949 Disorder of cartilage, unspecified: Secondary | ICD-10-CM

## 2012-04-20 DIAGNOSIS — R7309 Other abnormal glucose: Secondary | ICD-10-CM

## 2012-04-20 DIAGNOSIS — Z23 Encounter for immunization: Secondary | ICD-10-CM

## 2012-04-20 DIAGNOSIS — E785 Hyperlipidemia, unspecified: Secondary | ICD-10-CM

## 2012-04-20 DIAGNOSIS — Z01419 Encounter for gynecological examination (general) (routine) without abnormal findings: Secondary | ICD-10-CM | POA: Insufficient documentation

## 2012-04-20 DIAGNOSIS — Z0000 Encounter for general adult medical examination without abnormal findings: Secondary | ICD-10-CM

## 2012-04-20 LAB — LIPID PANEL
Cholesterol: 212 mg/dL — ABNORMAL HIGH (ref 0–200)
HDL: 69.1 mg/dL (ref >39.00)
Total CHOL/HDL Ratio: 3
Triglycerides: 79 mg/dL (ref 0.0–149.0)
VLDL: 15.8 mg/dL (ref 0.0–40.0)

## 2012-04-20 LAB — COMPREHENSIVE METABOLIC PANEL
ALT: 30 U/L (ref 0–35)
AST: 26 U/L (ref 0–37)
Albumin: 3.8 g/dL (ref 3.5–5.2)
Alkaline Phosphatase: 44 U/L (ref 39–117)
BUN: 9 mg/dL (ref 6–23)
CO2: 26 mEq/L (ref 19–32)
Calcium: 9.9 mg/dL (ref 8.4–10.5)
Chloride: 104 mEq/L (ref 96–112)
Creatinine, Ser: 0.9 mg/dL (ref 0.4–1.2)
GFR: 86.16 mL/min (ref >60.00)
Glucose, Bld: 78 mg/dL (ref 70–99)
Potassium: 3.7 mEq/L (ref 3.5–5.1)
Sodium: 137 mEq/L (ref 135–145)
Total Bilirubin: 0.5 mg/dL (ref 0.3–1.2)
Total Protein: 7.9 g/dL (ref 6.0–8.3)

## 2012-04-20 LAB — LDL CHOLESTEROL, DIRECT: Direct LDL: 125 mg/dL

## 2012-04-20 LAB — HEMOGLOBIN A1C: Hgb A1c MFr Bld: 6.1 % (ref 4.6–6.5)

## 2012-04-20 NOTE — Assessment & Plan Note (Addendum)
Td 2008 zostavax 11-2011 Flu shot today [facility] 02-2012  Colonoscopy --06/23/2000, in [place] as a part of her health plan (had no sx) , reportedly normal cscope 03-2011, polyps, tics, 10 years, Dr Leone Payor   Mammogram   9-13   normal , breast exam (-), rec SBE Multiple PAPs before, never had an abnormal one, last PAP 8-11 neg, PAP sent today  Diet and exercise discussed Occasional pain in the L trapezoid, recommend Tylenol, heating pad

## 2012-04-20 NOTE — Assessment & Plan Note (Signed)
Due for a bone density test in few months Continue exercise, calcium, vitamin D

## 2012-04-20 NOTE — Progress Notes (Signed)
  Subjective:    Patient ID: Alyssa Murray, female    DOB: 21-Jul-1950, 61 y.o.   MRN: [id number]  HPI CPX  Past Medical History  Diagnosis Date  . HSV infection     L buttocks  . Osteopenia   . Palpitation     cardiac ablation secondary to palptiation done aprox 2000- now Asx  . Allergic rhinitis   . Menopause 2005  . HYPERGLYCEMIA, BORDERLINE 01/23/2010    Qualifier: Diagnosis of  By: Drue Novel MD, Nolon Rod.    Past Surgical History  Procedure Date  . Cardiac electrophysiology mapping and ablation around 2000       . Bilateral bunionectomy 2002  . Colonoscopy 04/08/11  . 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 Denies fever, chills, cough No nausea, vomiting, diarrhea. No chest pain or shortness of breath No vaginal discharge, dysuria or blood in the stools.     Objective:   Physical Exam General -- alert, well-developed, and well-nourished.   Neck --no thyromegaly , normal carotid pulse Breasts-- No mass, nodules, thickening, tenderness, bulging, retraction, inflamation, nipple discharge or skin changes noted.  no axillary lymph nodes Lungs -- normal respiratory effort, no intercostal retractions, no accessory muscle use, and normal breath sounds.   Heart-- normal rate, regular rhythm, no murmur, and no gallop.   Abdomen--soft, non-tender, no distention, no masses, no HSM, no guarding, and no rigidity.   Extremities-- no pretibial edema bilaterally GU-- normal external examination, vagina without lesions or  discharge, cervix without lesions or discharge. Bimanual exam without mass or tenderness  Neurologic-- alert & oriented X3 and strength normal in all extremities. Psych-- Cognition and judgment appear intact. Alert and cooperative with normal attention span and concentration.  not anxious appearing and not depressed appearing.       Assessment & Plan:

## 2012-04-22 ENCOUNTER — Encounter: Payer: Self-pay | Admitting: *Deleted

## 2012-05-13 ENCOUNTER — Other Ambulatory Visit: Payer: Self-pay | Admitting: Internal Medicine

## 2012-05-14 NOTE — Telephone Encounter (Signed)
Refill done.  

## 2012-06-28 ENCOUNTER — Encounter: Payer: Self-pay | Admitting: Internal Medicine

## 2012-07-01 ENCOUNTER — Encounter: Payer: Self-pay | Admitting: Internal Medicine

## 2012-08-09 ENCOUNTER — Other Ambulatory Visit: Payer: Self-pay | Admitting: Internal Medicine

## 2012-08-24 ENCOUNTER — Other Ambulatory Visit: Payer: Self-pay | Admitting: Internal Medicine

## 2012-08-24 NOTE — Telephone Encounter (Signed)
Refill done.  

## 2012-10-06 ENCOUNTER — Telehealth: Payer: Self-pay | Admitting: *Deleted

## 2012-10-06 DIAGNOSIS — M899 Disorder of bone, unspecified: Secondary | ICD-10-CM

## 2012-10-06 DIAGNOSIS — M949 Disorder of cartilage, unspecified: Secondary | ICD-10-CM

## 2012-10-06 NOTE — Telephone Encounter (Signed)
Left msg on pt's vmail to return call. She is due for a bone density.

## 2012-10-12 ENCOUNTER — Telehealth: Payer: Self-pay | Admitting: Internal Medicine

## 2012-10-15 NOTE — Telephone Encounter (Signed)
Discussed with pt, entered order.  

## 2012-10-15 NOTE — Telephone Encounter (Signed)
Patient returned phone call. Best # is [phone]

## 2012-12-14 ENCOUNTER — Other Ambulatory Visit: Payer: PRIVATE HEALTH INSURANCE

## 2012-12-15 ENCOUNTER — Other Ambulatory Visit: Payer: PRIVATE HEALTH INSURANCE

## 2012-12-15 ENCOUNTER — Ambulatory Visit (INDEPENDENT_AMBULATORY_CARE_PROVIDER_SITE_OTHER)
Admission: RE | Admit: 2012-12-15 | Discharge: 2012-12-15 | Disposition: A | Discharge status: Home / Self Care | Payer: PRIVATE HEALTH INSURANCE | Source: Ambulatory Visit | Attending: Internal Medicine | Admitting: Internal Medicine

## 2012-12-15 DIAGNOSIS — M899 Disorder of bone, unspecified: Secondary | ICD-10-CM

## 2013-02-07 ENCOUNTER — Other Ambulatory Visit: Payer: Self-pay

## 2013-02-07 DIAGNOSIS — Z1231 Encounter for screening mammogram for malignant neoplasm of breast: Secondary | ICD-10-CM

## 2013-02-16 ENCOUNTER — Encounter: Payer: Self-pay | Admitting: Internal Medicine

## 2013-02-16 MED ORDER — VALACYCLOVIR HCL 500 MG PO TABS: ORAL_TABLET | ORAL | Status: DC

## 2013-02-18 NOTE — Telephone Encounter (Signed)
Pt requesting Valtrex. Pt states only has 2 tablets remaining. Last OV- 04/20/13. Future OV-05/11/13  Last Refill- 02/16/13 #14 refill-2  Please advise. DJR

## 2013-02-23 ENCOUNTER — Other Ambulatory Visit: Payer: Self-pay | Admitting: *Deleted

## 2013-02-23 DIAGNOSIS — B029 Zoster without complications: Secondary | ICD-10-CM

## 2013-02-23 MED ORDER — VALACYCLOVIR HCL 500 MG PO TABS: ORAL_TABLET | ORAL | Status: DC

## 2013-02-23 NOTE — Telephone Encounter (Signed)
Refill for valtrex sent to [facility]

## 2013-03-23 HISTORY — PX: FOOT SURGERY: SHX648

## 2013-04-22 ENCOUNTER — Encounter: Payer: PRIVATE HEALTH INSURANCE | Admitting: Internal Medicine

## 2013-04-28 ENCOUNTER — Other Ambulatory Visit: Payer: Self-pay

## 2013-05-09 ENCOUNTER — Ambulatory Visit: Payer: PRIVATE HEALTH INSURANCE

## 2013-05-10 ENCOUNTER — Telehealth: Payer: Self-pay

## 2013-05-10 NOTE — Telephone Encounter (Addendum)
Left message for call back Identifiable Medication and allergies: reviewed and updated  90 day supply/mail order: na Local pharmacy: [facility]   Immunizations due:  Admin flu vaccine upon arrival  A/P:   No changes to FH  Bunion/hammer toe repair x 6 weeks ago (L) recorded in Hx Tdap 6/ 2008  zostavax 11/2011  MMG--03/2012 Pap--03/2012--Multiple PAPs before, never had an abnormal one Colonoscopy --06/23/2000, in [place] as a part of her health plan (had no sx) , reportedly normal  cscope 03-2011, polyps, tics, 10 years, Dr Leone Payor  Dexa--12/2012--osteopenia  To Discuss with Provider: Not at this time

## 2013-05-11 ENCOUNTER — Encounter: Payer: Self-pay | Admitting: Internal Medicine

## 2013-05-11 ENCOUNTER — Ambulatory Visit (INDEPENDENT_AMBULATORY_CARE_PROVIDER_SITE_OTHER): Payer: PRIVATE HEALTH INSURANCE | Admitting: Internal Medicine

## 2013-05-11 VITALS — BP 121/71 | HR 78 | Temp 98.5°F | Resp 18 | Ht 66.25 in | Wt 161.0 lb

## 2013-05-11 DIAGNOSIS — M899 Disorder of bone, unspecified: Secondary | ICD-10-CM

## 2013-05-11 DIAGNOSIS — R7309 Other abnormal glucose: Secondary | ICD-10-CM

## 2013-05-11 DIAGNOSIS — Z0000 Encounter for general adult medical examination without abnormal findings: Secondary | ICD-10-CM

## 2013-05-11 LAB — HEMOGLOBIN A1C: Hgb A1c MFr Bld: 6.1 % (ref 4.6–6.5)

## 2013-05-11 LAB — COMPREHENSIVE METABOLIC PANEL
ALT: 22 U/L (ref 0–35)
AST: 20 U/L (ref 0–37)
Albumin: 4 g/dL (ref 3.5–5.2)
Alkaline Phosphatase: 52 U/L (ref 39–117)
BUN: 10 mg/dL (ref 6–23)
CO2: 24 mEq/L (ref 19–32)
Calcium: 9.8 mg/dL (ref 8.4–10.5)
Chloride: 105 mEq/L (ref 96–112)
Creatinine, Ser: 0.7 mg/dL (ref 0.4–1.2)
GFR: 102.13 mL/min (ref >60.00)
Glucose, Bld: 87 mg/dL (ref 70–99)
Potassium: 3.6 mEq/L (ref 3.5–5.1)
Sodium: 137 mEq/L (ref 135–145)
Total Bilirubin: 0.6 mg/dL (ref 0.3–1.2)
Total Protein: 7.7 g/dL (ref 6.0–8.3)

## 2013-05-11 LAB — CBC WITH DIFFERENTIAL/PLATELET
Basophils Absolute: 0 10*3/uL (ref 0.0–0.1)
Basophils Relative: 0.5 % (ref 0.0–3.0)
Eosinophils Absolute: 0.4 10*3/uL (ref 0.0–0.7)
Eosinophils Relative: 5.9 % — ABNORMAL HIGH (ref 0.0–5.0)
HCT: 39.2 % (ref 36.0–46.0)
Hemoglobin: 12.4 g/dL (ref 12.0–15.0)
Lymphocytes Relative: 31.8 % (ref 12.0–46.0)
Lymphs Abs: 2 10*3/uL (ref 0.7–4.0)
MCHC: 31.6 g/dL (ref 30.0–36.0)
MCV: 77.2 fl — ABNORMAL LOW (ref 78.0–100.0)
Monocytes Absolute: 0.4 10*3/uL (ref 0.1–1.0)
Monocytes Relative: 6.9 % (ref 3.0–12.0)
Neutro Abs: 3.5 10*3/uL (ref 1.4–7.7)
Neutrophils Relative %: 54.9 % (ref 43.0–77.0)
Platelets: 284 10*3/uL (ref 150.0–400.0)
RBC: 5.07 Mil/uL (ref 3.87–5.11)
RDW: 15 % — ABNORMAL HIGH (ref 11.5–14.6)
WBC: 6.3 10*3/uL (ref 4.5–10.5)

## 2013-05-11 LAB — LIPID PANEL
Cholesterol: 208 mg/dL — ABNORMAL HIGH (ref 0–200)
HDL: 58.5 mg/dL (ref >39.00)
Total CHOL/HDL Ratio: 4
Triglycerides: 62 mg/dL (ref 0.0–149.0)
VLDL: 12.4 mg/dL (ref 0.0–40.0)

## 2013-05-11 LAB — TSH: TSH: 1.9 u[IU]/mL (ref 0.35–5.50)

## 2013-05-11 LAB — LDL CHOLESTEROL, DIRECT: Direct LDL: 142.9 mg/dL

## 2013-05-11 NOTE — Progress Notes (Signed)
  Subjective:    Patient ID: Alyssa Murray, female    DOB: 1950-11-17, 62 y.o.   MRN: [id number]  HPI CPX  Past Medical History  Diagnosis Date  . HSV infection     L buttocks  . Osteopenia   . Palpitation     cardiac ablation secondary to palptiation done aprox 2000- now Asx  . Allergic rhinitis   . Menopause 2005  . HYPERGLYCEMIA, BORDERLINE 01/23/2010   Past Surgical History  Procedure Laterality Date  . Cardiac electrophysiology mapping and ablation  around 2000       . Bilateral bunionectomy  2002  . Colonoscopy  04/08/11  . Mouth surgery  03-2012    implants  . Foot surgery Left 03/2013    bunion/hammer toe   History   Social History  . Marital Status: Divorced    Spouse Name: N/A    Number of Children: 2  . Years of Education: N/A   Occupational History  . 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 RETIRED  receptionist at [employer]   Social History Main Topics  . Smoking status: Never Smoker   . Smokeless tobacco: Never Used  . Alcohol Use: Yes     Comment: occasional  . Drug Use: No  . Sexual Activity: Not on file   Other Topics Concern  . Not on file   Social History Narrative   2 sons, they are in [place] the other in [place]                   Family History  Problem Relation Age of Onset  . Hypertension Mother   . Coronary artery disease Neg Hx   . Diabetes Neg Hx   . Stroke Neg Hx   . Colon cancer Neg Hx   . Breast cancer Neg Hx   . Ovarian cancer Neg Hx   . Uterine cancer Neg Hx   . Stomach cancer Neg Hx     Review of Systems Diet-- healthy Exercise-- just had surgery (foot) but usually active No  CP, SOB, lower extremity edema Denies  nausea, vomiting diarrhea Denies  blood in the stools (-) cough, sputum production, (-) wheezing, chest congestion No dysuria, gross hematuria, difficulty urinating       Objective:   Physical Exam BP 121/71  Pulse 78  Temp(Src) 98.5 F (36.9 C) (Oral)  Resp 18  Ht 5' 6.25"  (1.683 m)  Wt 161 lb (73.029 kg)  BMI 25.78 kg/m2  SpO2 97% General -- alert, well-developed, NAD.  Neck --no thyromegaly , normal carotid pulse Breast-- no dominant mass, skin and nipples normal to inspection on palpation, axillary areas without mass or lymphadenopathy Lungs -- normal respiratory effort, no intercostal retractions, no accessory muscle use, and normal breath sounds.  Heart-- normal rate, regular rhythm, no murmur.  Abdomen-- Not distended, good bowel sounds,soft, non-tender. Extremities-- no pretibial edema bilaterally , s/p surgery L foot Neurologic--  alert & oriented X3. Speech normal, gait normal, strength normal in all extremities.   Psych-- Cognition and judgment appear intact. Cooperative with normal attention span and concentration. No anxious appearing , no depressed appearing.     Assessment & Plan:

## 2013-05-11 NOTE — Patient Instructions (Signed)
Get your blood work before you leave  Next visit in 1 year for a physical exam  . Fasting Please make an appointment     

## 2013-05-11 NOTE — Assessment & Plan Note (Addendum)
DEXA 12-2012 show mild osteopenia, continue w/ calcium, vitamin D and exercise. Next DEXA 2 or 3 years

## 2013-05-11 NOTE — Assessment & Plan Note (Addendum)
Td 2008 zostavax 11-2011 Flu shot today  MMG 02-2012, to have another MMG soon, breast exam today (-) Multiple PAPs before, never had an abnormal one, last PAP 03-2012, next 2016 Colonoscopy --06/23/2000, in [place] as a part of her health plan (had no sx) , reportedly normal cscope 03-2011, polyps, tics, 10 years, Dr Leone Payor  Diet -exercise discussed

## 2013-05-11 NOTE — Assessment & Plan Note (Signed)
Last A1c 6.1, labs today.

## 2013-05-11 NOTE — Progress Notes (Signed)
Pre-visit discussion using our clinic review tool, if applicable. No additional management support is needed unless otherwise documented below in the visit note.  

## 2013-05-13 ENCOUNTER — Other Ambulatory Visit: Payer: Self-pay | Admitting: *Deleted

## 2013-05-13 DIAGNOSIS — E785 Hyperlipidemia, unspecified: Secondary | ICD-10-CM

## 2013-05-13 MED ORDER — ATORVASTATIN CALCIUM 10 MG PO TABS: 10.0000 mg | ORAL_TABLET | Freq: Every day | ORAL | Status: DC

## 2013-05-26 ENCOUNTER — Ambulatory Visit
Admission: RE | Admit: 2013-05-26 | Discharge: 2013-05-26 | Disposition: A | Discharge status: Home / Self Care | Payer: PRIVATE HEALTH INSURANCE | Source: Ambulatory Visit

## 2013-05-26 DIAGNOSIS — Z1231 Encounter for screening mammogram for malignant neoplasm of breast: Secondary | ICD-10-CM

## 2013-10-08 ENCOUNTER — Encounter: Payer: Self-pay | Admitting: Internal Medicine

## 2013-10-10 ENCOUNTER — Other Ambulatory Visit: Payer: Self-pay | Admitting: *Deleted

## 2013-10-10 MED ORDER — ATORVASTATIN CALCIUM 10 MG PO TABS: 10.0000 mg | ORAL_TABLET | Freq: Every day | ORAL | Status: DC

## 2014-03-01 ENCOUNTER — Ambulatory Visit: Payer: PRIVATE HEALTH INSURANCE

## 2014-03-02 ENCOUNTER — Ambulatory Visit: Payer: PRIVATE HEALTH INSURANCE

## 2014-03-06 ENCOUNTER — Other Ambulatory Visit: Payer: Self-pay | Admitting: Internal Medicine

## 2014-03-08 ENCOUNTER — Ambulatory Visit (INDEPENDENT_AMBULATORY_CARE_PROVIDER_SITE_OTHER): Payer: PRIVATE HEALTH INSURANCE

## 2014-03-08 DIAGNOSIS — Z23 Encounter for immunization: Secondary | ICD-10-CM

## 2014-04-11 ENCOUNTER — Encounter: Payer: Self-pay | Admitting: Internal Medicine

## 2014-04-13 ENCOUNTER — Other Ambulatory Visit: Payer: Self-pay

## 2014-04-28 ENCOUNTER — Other Ambulatory Visit: Payer: Self-pay

## 2014-04-28 DIAGNOSIS — Z1231 Encounter for screening mammogram for malignant neoplasm of breast: Secondary | ICD-10-CM

## 2014-05-15 ENCOUNTER — Ambulatory Visit (INDEPENDENT_AMBULATORY_CARE_PROVIDER_SITE_OTHER): Payer: PRIVATE HEALTH INSURANCE | Admitting: Internal Medicine

## 2014-05-15 ENCOUNTER — Encounter: Payer: Self-pay | Admitting: Internal Medicine

## 2014-05-15 VITALS — BP 131/75 | HR 80 | Temp 97.5°F | Ht 66.0 in | Wt 164.5 lb

## 2014-05-15 DIAGNOSIS — R7303 Prediabetes: Secondary | ICD-10-CM

## 2014-05-15 DIAGNOSIS — R7309 Other abnormal glucose: Secondary | ICD-10-CM

## 2014-05-15 DIAGNOSIS — Z0000 Encounter for general adult medical examination without abnormal findings: Secondary | ICD-10-CM

## 2014-05-15 LAB — LIPID PANEL
Cholesterol: 180 mg/dL (ref 0–200)
HDL: 68.7 mg/dL (ref >39.00)
LDL Cholesterol: 102 mg/dL — ABNORMAL HIGH (ref 0–99)
NonHDL: 111.3
Total CHOL/HDL Ratio: 3
Triglycerides: 45 mg/dL (ref 0.0–149.0)
VLDL: 9 mg/dL (ref 0.0–40.0)

## 2014-05-15 LAB — COMPREHENSIVE METABOLIC PANEL
ALT: 35 U/L (ref 0–35)
AST: 32 U/L (ref 0–37)
Albumin: 3.8 g/dL (ref 3.5–5.2)
Alkaline Phosphatase: 44 U/L (ref 39–117)
BUN: 9 mg/dL (ref 6–23)
CO2: 23 mEq/L (ref 19–32)
Calcium: 9.7 mg/dL (ref 8.4–10.5)
Chloride: 108 mEq/L (ref 96–112)
Creatinine, Ser: 0.8 mg/dL (ref 0.4–1.2)
GFR: 97.23 mL/min (ref >60.00)
Glucose, Bld: 95 mg/dL (ref 70–99)
Potassium: 4 mEq/L (ref 3.5–5.1)
Sodium: 139 mEq/L (ref 135–145)
Total Bilirubin: 0.3 mg/dL (ref 0.2–1.2)
Total Protein: 7.4 g/dL (ref 6.0–8.3)

## 2014-05-15 LAB — HEMOGLOBIN: Hemoglobin: 11.7 g/dL — ABNORMAL LOW (ref 12.0–15.0)

## 2014-05-15 LAB — IRON: Iron: 53 ug/dL (ref 42–145)

## 2014-05-15 LAB — FERRITIN: Ferritin: 68.9 ng/mL (ref 10.0–291.0)

## 2014-05-15 LAB — HEMOGLOBIN A1C: Hgb A1c MFr Bld: 6.1 % (ref 4.6–6.5)

## 2014-05-15 NOTE — Assessment & Plan Note (Addendum)
Td 2008 zostavax 11-2011 Had a Flu shot    Breast ca screening:  MMG 05-2013, has another schedule for this year Last breast exam 2014 (-)  Multiple PAPs before, never had an abnormal one, last PAP 03-2012, next 2016  Colonoscopy --06/23/2000, in [place] as a part of her health plan (had no sx) , reportedly normal cscope 03-2011, polyps, tics, 10 years, Dr Carlean Purl   Diet -exercise discussed   Other issues: Hyperlipidemia, started Lipitor based on last cholesterol panel , labs. Prediabetes, check A1c. History of mild anemia, on supplements. Will check hemoglobin, iron, ferritin. If normal will discontinue iron supplements

## 2014-05-15 NOTE — Patient Instructions (Signed)
Get your blood work before you leave   continue taking the same medications, call for refills when needed     Please come back to the office in 1 year for a physical exam. Come back fasting

## 2014-05-15 NOTE — Progress Notes (Signed)
Pre visit review using our clinic review tool, if applicable. No additional management support is needed unless otherwise documented below in the visit note. 

## 2014-05-15 NOTE — Progress Notes (Signed)
Subjective:    Patient ID: Alyssa Murray, female    DOB: [birth year], 63 y.o.   MRN: [id number]  DOS:  05/15/2014 Type of visit - description : CPX Interval history: Feeling well   ROS Diet-- healthy Exercise-- x3/week No  CP, SOB No palpitations, no lower extremity edema Denies  nausea, vomiting diarrhea, blood in the stools (-) cough, sputum production (-) wheezing, chest congestion No dysuria, gross hematuria, difficulty urinating  No vaginal bleed. No anxiety, depression    Past Medical History  Diagnosis Date  . HSV infection     L buttocks  . Osteopenia   . Palpitation     cardiac ablation secondary to palptiation done aprox 2000- now Asx  . Allergic rhinitis   . Menopause 2005  . HYPERGLYCEMIA, BORDERLINE 01/23/2010    Past Surgical History  Procedure Laterality Date  . Cardiac electrophysiology mapping and ablation  around 2000       . Bilateral bunionectomy  2002  . Colonoscopy  04/08/11  . Mouth surgery  03-2012    implants  . Foot surgery Left 03/2013    bunion/hammer toe    History   Social History  . Marital Status: Divorced    Spouse Name: N/A    Number of Children: 2  . Years of Education: N/A   Occupational History  . 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 Smoking status: Never Smoker   . Smokeless tobacco: Never Used  . Alcohol Use: Yes     Comment: occasional  . Drug Use: No  . Sexual Activity: Not on file   Other Topics Concern  . Not on file   Social History Narrative   2 sons, they are in [place] the other in [place]-- pt                     Family History  Problem Relation Age of Onset  . Hypertension Mother   . Coronary artery disease Neg Hx   . Diabetes Neg Hx   . Stroke Neg Hx   . Colon cancer Neg Hx   . Breast cancer Neg Hx   . Ovarian cancer Neg Hx   . Uterine cancer Neg Hx   . Stomach cancer Neg Hx          Medication List       This list is accurate as of: 05/15/14  1:01 PM.  Always use your most recent med list.               atorvastatin 10 MG tablet  Commonly known as:  LIPITOR  Take 1 tablet (10 mg total) by mouth at bedtime.     calcium-vitamin D 250-125 MG-UNIT per tablet  Commonly known as:  OSCAL WITH D  Take 1 tablet by mouth daily.     clotrimazole-betamethasone cream  Commonly known as:  LOTRISONE  APPLY TOPICALLY TWO TIMES DAILY     COCONUT OIL PO  Take 1 tablet by mouth daily.     COD LIVER OIL PO  Take by mouth.     ferrous gluconate 240 (27 FE) MG tablet  Commonly known as:  FERGON  Take 240 mg by mouth 3 (three) times daily with meals.     GARLIC-PARSLEY PO  Take by mouth.     Ginkgo Biloba 60 MG Tabs  Take  by mouth.     multivitamin per tablet  Take 1 tablet by mouth daily.     valACYclovir 500 MG tablet  Commonly known as:  VALTREX  TAKE ONE (1) TABLET BY MOUTH TWO (2) TIMES DAILY     vitamin C 500 MG tablet  Commonly known as:  ASCORBIC ACID  Take 500 mg by mouth daily.           Objective:   Physical Exam BP 131/75 mmHg  Pulse 80  Temp(Src) 97.5 F (36.4 C) (Oral)  Ht 5\' 6"  (1.676 m)  Wt 164 lb 8 oz (74.617 kg)  BMI 26.56 kg/m2  SpO2 96%  General -- alert, well-developed, NAD.  Neck --no thyromegaly  HEENT-- Not pale.  Lungs -- normal respiratory effort, no intercostal retractions, no accessory muscle use, and normal breath sounds.  Heart-- normal rate, regular rhythm, no murmur.  Abdomen-- Not distended, good bowel sounds,soft, non-tender.  Extremities-- no pretibial edema bilaterally  Neurologic--  alert & oriented X3. Speech normal, gait appropriate for age, strength symmetric and appropriate for age.  Psych-- Cognition and judgment appear intact. Cooperative with normal attention span and concentration. No anxious or depressed appearing.       Assessment & Plan:

## 2014-05-29 ENCOUNTER — Ambulatory Visit
Admission: RE | Admit: 2014-05-29 | Discharge: 2014-05-29 | Disposition: A | Discharge status: Home / Self Care | Payer: PRIVATE HEALTH INSURANCE | Source: Ambulatory Visit

## 2014-05-29 DIAGNOSIS — Z1231 Encounter for screening mammogram for malignant neoplasm of breast: Secondary | ICD-10-CM

## 2014-08-08 ENCOUNTER — Other Ambulatory Visit: Payer: Self-pay | Admitting: Internal Medicine

## 2014-08-08 NOTE — Telephone Encounter (Signed)
Pt is requesting refill on Clotrimazole-bethamethasone cream.   Last OV: 05/15/2014 Last Fill: 05/14/2012 30g 0RF  Please advise.

## 2014-08-08 NOTE — Telephone Encounter (Signed)
Ok x 1 , advise patient to call if she has a persistent/severe rash

## 2014-10-31 ENCOUNTER — Encounter: Payer: Self-pay | Admitting: Internal Medicine

## 2015-05-07 ENCOUNTER — Encounter: Payer: Self-pay | Admitting: Internal Medicine

## 2015-05-07 ENCOUNTER — Other Ambulatory Visit: Payer: Self-pay

## 2015-05-07 ENCOUNTER — Other Ambulatory Visit: Payer: Self-pay | Admitting: Internal Medicine

## 2015-05-07 DIAGNOSIS — Z1231 Encounter for screening mammogram for malignant neoplasm of breast: Secondary | ICD-10-CM

## 2015-05-07 MED ORDER — CLOTRIMAZOLE-BETAMETHASONE 1-0.05 % EX CREA: TOPICAL_CREAM | Freq: Two times a day (BID) | CUTANEOUS | Status: DC

## 2015-05-21 ENCOUNTER — Telehealth: Payer: Self-pay

## 2015-05-21 NOTE — Telephone Encounter (Signed)
Left message for return call (Pre-Visit)

## 2015-05-22 ENCOUNTER — Encounter: Payer: Self-pay | Admitting: Internal Medicine

## 2015-05-22 ENCOUNTER — Other Ambulatory Visit (HOSPITAL_COMMUNITY)
Admission: RE | Admit: 2015-05-22 | Discharge: 2015-05-22 | Disposition: A | Discharge status: Home / Self Care | Payer: BLUE CROSS/BLUE SHIELD | Source: Ambulatory Visit | Attending: Internal Medicine | Admitting: Internal Medicine

## 2015-05-22 ENCOUNTER — Ambulatory Visit (INDEPENDENT_AMBULATORY_CARE_PROVIDER_SITE_OTHER): Payer: PRIVATE HEALTH INSURANCE | Admitting: Internal Medicine

## 2015-05-22 VITALS — BP 108/64 | HR 76 | Temp 97.7°F | Ht 66.0 in | Wt 156.1 lb

## 2015-05-22 DIAGNOSIS — Z1151 Encounter for screening for human papillomavirus (HPV): Secondary | ICD-10-CM | POA: Insufficient documentation

## 2015-05-22 DIAGNOSIS — Z01419 Encounter for gynecological examination (general) (routine) without abnormal findings: Secondary | ICD-10-CM

## 2015-05-22 DIAGNOSIS — Z0000 Encounter for general adult medical examination without abnormal findings: Secondary | ICD-10-CM

## 2015-05-22 DIAGNOSIS — R7303 Prediabetes: Secondary | ICD-10-CM

## 2015-05-22 DIAGNOSIS — Z09 Encounter for follow-up examination after completed treatment for conditions other than malignant neoplasm: Secondary | ICD-10-CM

## 2015-05-22 DIAGNOSIS — Z23 Encounter for immunization: Secondary | ICD-10-CM

## 2015-05-22 DIAGNOSIS — Z114 Encounter for screening for human immunodeficiency virus [HIV]: Secondary | ICD-10-CM

## 2015-05-22 DIAGNOSIS — Z1159 Encounter for screening for other viral diseases: Secondary | ICD-10-CM

## 2015-05-22 LAB — LIPID PANEL
Cholesterol: 202 mg/dL — ABNORMAL HIGH (ref 0–200)
HDL: 61.7 mg/dL (ref >39.00)
LDL Cholesterol: 129 mg/dL — ABNORMAL HIGH (ref 0–99)
NonHDL: 140.13
Total CHOL/HDL Ratio: 3
Triglycerides: 58 mg/dL (ref 0.0–149.0)
VLDL: 11.6 mg/dL (ref 0.0–40.0)

## 2015-05-22 LAB — CBC WITH DIFFERENTIAL/PLATELET
Basophils Absolute: 0 10*3/uL (ref 0.0–0.1)
Basophils Relative: 0.7 % (ref 0.0–3.0)
Eosinophils Absolute: 0.2 10*3/uL (ref 0.0–0.7)
Eosinophils Relative: 3.5 % (ref 0.0–5.0)
HCT: 36.7 % (ref 36.0–46.0)
Hemoglobin: 11.5 g/dL — ABNORMAL LOW (ref 12.0–15.0)
Lymphocytes Relative: 32.4 % (ref 12.0–46.0)
Lymphs Abs: 2.3 10*3/uL (ref 0.7–4.0)
MCHC: 31.3 g/dL (ref 30.0–36.0)
MCV: 75.3 fl — ABNORMAL LOW (ref 78.0–100.0)
Monocytes Absolute: 0.6 10*3/uL (ref 0.1–1.0)
Monocytes Relative: 8.9 % (ref 3.0–12.0)
Neutro Abs: 3.8 10*3/uL (ref 1.4–7.7)
Neutrophils Relative %: 54.5 % (ref 43.0–77.0)
Platelets: 322 10*3/uL (ref 150.0–400.0)
RBC: 4.88 Mil/uL (ref 3.87–5.11)
RDW: 17.1 % — ABNORMAL HIGH (ref 11.5–15.5)
WBC: 7 10*3/uL (ref 4.0–10.5)

## 2015-05-22 LAB — COMPREHENSIVE METABOLIC PANEL
ALT: 18 U/L (ref 0–35)
AST: 20 U/L (ref 0–37)
Albumin: 3.8 g/dL (ref 3.5–5.2)
Alkaline Phosphatase: 49 U/L (ref 39–117)
BUN: 15 mg/dL (ref 6–23)
CO2: 27 mEq/L (ref 19–32)
Calcium: 9.9 mg/dL (ref 8.4–10.5)
Chloride: 107 mEq/L (ref 96–112)
Creatinine, Ser: 0.85 mg/dL (ref 0.40–1.20)
GFR: 86.47 mL/min (ref >60.00)
Glucose, Bld: 99 mg/dL (ref 70–99)
Potassium: 4.4 mEq/L (ref 3.5–5.1)
Sodium: 141 mEq/L (ref 135–145)
Total Bilirubin: 0.4 mg/dL (ref 0.2–1.2)
Total Protein: 7.6 g/dL (ref 6.0–8.3)

## 2015-05-22 LAB — HEMOGLOBIN A1C: Hgb A1c MFr Bld: 6.2 % (ref 4.6–6.5)

## 2015-05-22 LAB — VITAMIN B12: Vitamin B-12: 1187 pg/mL — ABNORMAL HIGH (ref 211–911)

## 2015-05-22 LAB — FOLATE: Folate: 17.2 ng/mL (ref >5.9)

## 2015-05-22 NOTE — Progress Notes (Signed)
Pre visit review using our clinic review tool, if applicable. No additional management support is needed unless otherwise documented below in the visit note. 

## 2015-05-22 NOTE — Patient Instructions (Signed)
Get your blood work before you leave     Next visit  for a physical exam in one year, fasting     Please schedule an appointment at the front desk  

## 2015-05-22 NOTE — Progress Notes (Signed)
Subjective:    Patient ID: Alyssa Murray, female    DOB: 1951/02/07, 64 y.o.   MRN: [id number]  DOS:  05/22/2015 Type of visit - description : Complete physical exam Interval history: No major concerns, self DC Lipitor to see how she is doing w/o it (taking coconut oil)    Review of Systems Constitutional: No fever. No chills. No unexplained wt changes. No unusual sweats  HEENT: No dental problems, no ear discharge, no facial swelling, no voice changes. No eye discharge, no eye  redness , no  intolerance to light   Respiratory: No wheezing , no  difficulty breathing. Persistent mild cough  Cardiovascular: No CP, no leg swelling , no  Palpitations  GI: no nausea, no vomiting, no diarrhea , no  abdominal pain.  No blood in the stools. No dysphagia, no odynophagia    Endocrine: No polyphagia, no polyuria , no polydipsia  GU: No dysuria, gross hematuria, difficulty urinating. No urinary urgency, no frequency.  Musculoskeletal: No joint swellings or unusual aches or pains  Skin: No change in the color of the skin, palor , no  Rash  Allergic, immunologic: No environmental allergies , no  food allergies  Neurological: No dizziness no  syncope. No headaches. No diplopia, no slurred, no slurred speech, no motor deficits, no facial  Numbness  Hematological: No enlarged lymph nodes, no easy bruising , no unusual bleedings  Psychiatry: No suicidal ideas, no hallucinations, no beavior problems, no confusion.  No unusual/severe anxiety, no depression   Past Medical History  Diagnosis Date  . HSV infection     L buttocks  . Osteopenia   . Palpitation     cardiac ablation secondary to palptiation done aprox 2000- now Asx  . Allergic rhinitis   . Menopause 2005  . HYPERGLYCEMIA, BORDERLINE 01/23/2010    Past Surgical History  Procedure Laterality Date  . Cardiac electrophysiology mapping and ablation  around 2000       . Bilateral bunionectomy  2002  . Colonoscopy  04/08/11  .  Mouth surgery  03-2012    implants  . Foot surgery Left 03/2013    bunion/hammer toe    Social History   Social History  . Marital Status: Divorced    Spouse Name: N/A  . Number of Children: 2  . Years of Education: N/A   Occupational History  . 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 Smoking status: Never Smoker   . Smokeless tobacco: Never Used  . Alcohol Use: Yes     Comment: occasional  . Drug Use: No  . Sexual Activity: Not on file   Other Topics Concern  . Not on file   Social History Narrative   2 sons, they are in [place] the other in [place]-- pt                   Family History  Problem Relation Age of Onset  . Hypertension Mother   . Coronary artery disease Neg Hx   . Diabetes Other     G parents   . Stroke Neg Hx   . Colon cancer Neg Hx   . Breast cancer Neg Hx   . Ovarian cancer Neg Hx   . Uterine cancer Neg Hx   . Stomach cancer Neg Hx         Medication  List       This list is accurate as of: 05/22/15 11:59 PM.  Always use your most recent med list.               calcium-vitamin D 250-125 MG-UNIT tablet  Commonly known as:  OSCAL WITH D  Take 1 tablet by mouth daily.     clotrimazole-betamethasone cream  Commonly known as:  LOTRISONE  Apply topically 2 (two) times daily.     COCONUT OIL PO  Take 1 tablet by mouth daily.     COD LIVER OIL PO  Take by mouth.     ferrous gluconate 240 (27 FE) MG tablet  Commonly known as:  FERGON  Take 240 mg by mouth 3 (three) times daily with meals.     GARLIC-PARSLEY PO  Take by mouth.     Ginkgo Biloba 60 MG Tabs  Take by mouth.     Magnesium 250 MG Tabs  Take 1 tablet by mouth daily.     multivitamin per tablet  Take 1 tablet by mouth daily.     valACYclovir 500 MG tablet  Commonly known as:  VALTREX  TAKE ONE (1) TABLET BY MOUTH TWO (2) TIMES DAILY     vitamin C 500 MG tablet    Commonly known as:  ASCORBIC ACID  Take 500 mg by mouth daily.           Objective:   Physical Exam BP 108/64 mmHg  Pulse 76  Temp(Src) 97.7 F (36.5 C) (Oral)  Ht 5\' 6"  (1.676 m)  Wt 156 lb 2 oz (70.818 kg)  BMI 25.21 kg/m2  SpO2 96% General:   Well developed, well nourished . NAD.  Neck:   No  Thyromegaly Breast: no dominant mass, skin and nipples normal to inspection on palpation, axillary areas without mass or lymphadenopathy HEENT:  Normocephalic . Face symmetric, atraumatic Lungs:  CTA B Normal respiratory effort, no intercostal retractions, no accessory muscle use. Heart: RRR,  no murmur.  No pretibial edema bilaterally  Abdomen:  Not distended, soft, non-tender. No rebound or rigidity. GU: External genitalia normal. Vaginal exam: No lesions, discharge. Cervix: Slightly pale, no lesions no discharge. Bimanual: No masses, no CMT Skin: Exposed areas without rash. Not pale. Not jaundice Neurologic:  alert & oriented X3.  Speech normal, gait appropriate for age and unassisted Strength symmetric and appropriate for age.  Psych: Cognition and judgment appear intact.  Cooperative with normal attention span and concentration.  Behavior appropriate. No anxious or depressed appearing.    Assessment & Plan:   Assessment> Prediabetes Hyperlipidemia Cardiac ablation due to palpitations ~ 2000: now asx Mild anemia 2015, normal iron Osteopenia -- T score -1.5  (2014) Rash (prn  lotrisone, groin) HSV infection, left buttock  Plan: Prediabetes: Check A1c Hyperlipidemia: Self DC Lipitor to see how she was doing with diet and Coconut oil. No side effects from statins. Recheck FLP, consider restart Lipitor Anemia: Check labs Osteopenia: On calcium, vitamin D, consider a DEXA next year RTC 1 year RTC 1 year

## 2015-05-22 NOTE — Assessment & Plan Note (Addendum)
Td 2008 zostavax 11-2011  Flu shot  -- today  Breast ca screening:  MMG 05-2014 (-)  breast exam today wnl  Multiple PAPs before, never had an abnormal one, last PAP 03-2012, PAP sent today  Colonoscopy --06/23/2000, in [place] as a part of her health plan (had no sx) , reportedly normal cscope 03-2011, polyps, tics, 10 years, Dr Carlean Purl   Diet -exercise discussed

## 2015-05-23 DIAGNOSIS — Z09 Encounter for follow-up examination after completed treatment for conditions other than malignant neoplasm: Secondary | ICD-10-CM | POA: Insufficient documentation

## 2015-05-23 LAB — HIV ANTIBODY (ROUTINE TESTING W REFLEX): HIV 1&2 Ab, 4th Generation: NONREACTIVE

## 2015-05-23 LAB — HEPATITIS C ANTIBODY: HCV Ab: NEGATIVE

## 2015-05-23 NOTE — Assessment & Plan Note (Signed)
Prediabetes: Check A1c Hyperlipidemia: Self DC Lipitor to see how she was doing with diet and Coconut oil. No side effects from statins. Recheck FLP, consider restart Lipitor Anemia: Check labs Osteopenia: On calcium, vitamin D, consider a DEXA next year RTC 1 year

## 2015-05-25 LAB — CYTOLOGY - PAP

## 2015-06-21 ENCOUNTER — Ambulatory Visit
Admission: RE | Admit: 2015-06-21 | Discharge: 2015-06-21 | Disposition: A | Discharge status: Home / Self Care | Payer: PRIVATE HEALTH INSURANCE | Source: Ambulatory Visit

## 2015-06-21 DIAGNOSIS — Z1231 Encounter for screening mammogram for malignant neoplasm of breast: Secondary | ICD-10-CM

## 2015-06-27 ENCOUNTER — Encounter: Payer: Self-pay | Admitting: Internal Medicine

## 2015-06-28 ENCOUNTER — Other Ambulatory Visit: Payer: Self-pay

## 2015-06-28 MED ORDER — VALACYCLOVIR HCL 500 MG PO TABS: 500.0000 mg | ORAL_TABLET | Freq: Two times a day (BID) | ORAL | Status: DC | PRN

## 2015-07-16 ENCOUNTER — Telehealth: Payer: Self-pay | Admitting: *Deleted

## 2015-07-16 NOTE — Telephone Encounter (Signed)
Received fax from [facility] requesting pt Medical Records; forwarded to [place] for scan/Email/SLS 01/23

## 2015-09-12 ENCOUNTER — Ambulatory Visit: Payer: Self-pay

## 2015-09-12 ENCOUNTER — Other Ambulatory Visit: Payer: Self-pay | Admitting: Occupational Medicine

## 2015-09-12 DIAGNOSIS — Z0000 Encounter for general adult medical examination without abnormal findings: Secondary | ICD-10-CM

## 2015-10-21 ENCOUNTER — Encounter: Payer: Self-pay | Admitting: Internal Medicine

## 2016-01-13 DIAGNOSIS — R002 Palpitations: Secondary | ICD-10-CM | POA: Diagnosis not present

## 2016-01-13 DIAGNOSIS — I349 Nonrheumatic mitral valve disorder, unspecified: Secondary | ICD-10-CM | POA: Diagnosis not present

## 2016-01-13 DIAGNOSIS — I456 Pre-excitation syndrome: Secondary | ICD-10-CM | POA: Diagnosis not present

## 2016-01-13 DIAGNOSIS — E7800 Pure hypercholesterolemia, unspecified: Secondary | ICD-10-CM | POA: Diagnosis not present

## 2016-01-14 DIAGNOSIS — R079 Chest pain, unspecified: Secondary | ICD-10-CM | POA: Diagnosis not present

## 2016-01-14 DIAGNOSIS — E0821 Diabetes mellitus due to underlying condition with diabetic nephropathy: Secondary | ICD-10-CM | POA: Diagnosis not present

## 2016-01-14 DIAGNOSIS — R42 Dizziness and giddiness: Secondary | ICD-10-CM | POA: Diagnosis not present

## 2016-01-14 DIAGNOSIS — I151 Hypertension secondary to other renal disorders: Secondary | ICD-10-CM | POA: Diagnosis not present

## 2016-01-14 LAB — CBC AND DIFFERENTIAL
HCT: 42 % (ref 36–46)
Hemoglobin: 13.3 g/dL (ref 12.0–16.0)
Neutrophils Absolute: 2 /uL
Platelets: 239 10*3/uL (ref 150–399)
WBC: 5 10^3/mL

## 2016-01-14 LAB — BASIC METABOLIC PANEL
BUN: 12 mg/dL (ref 4–21)
Creatinine: 1 mg/dL (ref 0.5–1.1)
Glucose: 100 mg/dL
Potassium: 4.7 mmol/L (ref 3.4–5.3)
Sodium: 140 mmol/L (ref 137–147)

## 2016-01-14 LAB — HEPATIC FUNCTION PANEL
ALT: 29 U/L (ref 7–35)
AST: 29 U/L (ref 13–35)
Alkaline Phosphatase: 57 U/L (ref 25–125)
Bilirubin, Direct: 0.2 mg/dL (ref 0.01–0.4)
Bilirubin, Total: 0.6 mg/dL

## 2016-01-17 DIAGNOSIS — I349 Nonrheumatic mitral valve disorder, unspecified: Secondary | ICD-10-CM | POA: Diagnosis not present

## 2016-01-17 DIAGNOSIS — E7800 Pure hypercholesterolemia, unspecified: Secondary | ICD-10-CM | POA: Diagnosis not present

## 2016-01-17 DIAGNOSIS — R079 Chest pain, unspecified: Secondary | ICD-10-CM | POA: Diagnosis not present

## 2016-01-17 DIAGNOSIS — I456 Pre-excitation syndrome: Secondary | ICD-10-CM | POA: Diagnosis not present

## 2016-01-23 ENCOUNTER — Encounter: Payer: Self-pay | Admitting: Internal Medicine

## 2016-02-18 ENCOUNTER — Telehealth: Payer: Self-pay | Admitting: Internal Medicine

## 2016-02-18 NOTE — Telephone Encounter (Signed)
Please advise 

## 2016-02-18 NOTE — Telephone Encounter (Signed)
That is very good, please enter the orders

## 2016-02-18 NOTE — Telephone Encounter (Signed)
Patient requesting flu shot & pneumonia orders. Patient has a scheduled appointment for 03/11/2016 with nurse.

## 2016-03-11 ENCOUNTER — Ambulatory Visit (INDEPENDENT_AMBULATORY_CARE_PROVIDER_SITE_OTHER): Payer: Medicare Other | Admitting: Behavioral Health

## 2016-03-11 DIAGNOSIS — Z23 Encounter for immunization: Secondary | ICD-10-CM | POA: Diagnosis not present

## 2016-03-11 NOTE — Progress Notes (Addendum)
Pre visit review using our clinic review tool, if applicable. No additional management support is needed unless otherwise documented below in the visit note.  Patient in office today for Influenza & Pneumococcal (13) vaccination. IM's given in both the Right & Left Deltoid. Patient tolerated injections well.

## 2016-05-22 ENCOUNTER — Encounter: Payer: Self-pay | Admitting: Internal Medicine

## 2016-05-22 ENCOUNTER — Other Ambulatory Visit: Payer: Self-pay | Admitting: Internal Medicine

## 2016-05-22 ENCOUNTER — Ambulatory Visit (INDEPENDENT_AMBULATORY_CARE_PROVIDER_SITE_OTHER): Payer: Medicare Other | Admitting: Internal Medicine

## 2016-05-22 VITALS — BP 118/64 | HR 75 | Temp 97.6°F | Resp 14 | Ht 66.0 in | Wt 157.0 lb

## 2016-05-22 DIAGNOSIS — Z862 Personal history of diseases of the blood and blood-forming organs and certain disorders involving the immune mechanism: Secondary | ICD-10-CM | POA: Diagnosis not present

## 2016-05-22 DIAGNOSIS — M8580 Other specified disorders of bone density and structure, unspecified site: Secondary | ICD-10-CM | POA: Diagnosis not present

## 2016-05-22 DIAGNOSIS — E785 Hyperlipidemia, unspecified: Secondary | ICD-10-CM | POA: Diagnosis not present

## 2016-05-22 DIAGNOSIS — R7303 Prediabetes: Secondary | ICD-10-CM

## 2016-05-22 DIAGNOSIS — Z9889 Other specified postprocedural states: Secondary | ICD-10-CM

## 2016-05-22 DIAGNOSIS — Z0000 Encounter for general adult medical examination without abnormal findings: Secondary | ICD-10-CM

## 2016-05-22 DIAGNOSIS — E559 Vitamin D deficiency, unspecified: Secondary | ICD-10-CM

## 2016-05-22 DIAGNOSIS — Z1231 Encounter for screening mammogram for malignant neoplasm of breast: Secondary | ICD-10-CM

## 2016-05-22 LAB — LIPID PANEL
Cholesterol: 214 mg/dL — ABNORMAL HIGH (ref 0–200)
HDL: 58.4 mg/dL (ref >39.00)
LDL Cholesterol: 144 mg/dL — ABNORMAL HIGH (ref 0–99)
NonHDL: 155.6
Total CHOL/HDL Ratio: 4
Triglycerides: 57 mg/dL (ref 0.0–149.0)
VLDL: 11.4 mg/dL (ref 0.0–40.0)

## 2016-05-22 LAB — CBC WITH DIFFERENTIAL/PLATELET
Basophils Absolute: 0 10*3/uL (ref 0.0–0.1)
Basophils Relative: 0.6 % (ref 0.0–3.0)
Eosinophils Absolute: 0.3 10*3/uL (ref 0.0–0.7)
Eosinophils Relative: 4.1 % (ref 0.0–5.0)
HCT: 40.8 % (ref 36.0–46.0)
Hemoglobin: 12.9 g/dL (ref 12.0–15.0)
Lymphocytes Relative: 28.6 % (ref 12.0–46.0)
Lymphs Abs: 1.8 10*3/uL (ref 0.7–4.0)
MCHC: 31.7 g/dL (ref 30.0–36.0)
MCV: 78.6 fl (ref 78.0–100.0)
Monocytes Absolute: 0.6 10*3/uL (ref 0.1–1.0)
Monocytes Relative: 10.1 % (ref 3.0–12.0)
Neutro Abs: 3.6 10*3/uL (ref 1.4–7.7)
Neutrophils Relative %: 56.6 % (ref 43.0–77.0)
Platelets: 264 10*3/uL (ref 150.0–400.0)
RBC: 5.19 Mil/uL — ABNORMAL HIGH (ref 3.87–5.11)
RDW: 14.8 % (ref 11.5–15.5)
WBC: 6.4 10*3/uL (ref 4.0–10.5)

## 2016-05-22 LAB — HEMOGLOBIN A1C: Hgb A1c MFr Bld: 5.9 % (ref 4.6–6.5)

## 2016-05-22 NOTE — Assessment & Plan Note (Signed)
Td 2008; zostavax 11-2011; prevnar 9-17  Flu shot 02-2016  Breast ca screening MMG 05-2015 (-)   Multiple PAPs before, never had an abnormal one, last PAP-HPV (-) 04-2015 Colonoscopy --06/23/2000, in [place] as a part of her health plan (had no sx) , reportedly normal cscope 03-2011, polyps, tics, 10 years, Dr Carlean Purl   Diet -exercise discussed

## 2016-05-22 NOTE — Progress Notes (Signed)
Pre visit review using our clinic review tool, if applicable. No additional management support is needed unless otherwise documented below in the visit note. 

## 2016-05-22 NOTE — Progress Notes (Signed)
Subjective:    Patient ID: Alyssa Murray, female    DOB: [birth year], 65 y.o.   MRN: [id number]  DOS:  05/22/2016 Type of visit - description :  Interval history:  Here for Medicare AWV:  1. Risk factors based on Past M, S, F history: reviewed 2. Physical Activities:  Gym x 3 /week, very active in the yard 3. Depression/mood: neg screening  4. Hearing:  No problems noted or reported  5. ADL's: independent, drives  6. Fall Risk: no recent falls, prevention discussed , see AVS 7. home Safety: does feel safe at home  8. Height, weight, & visual acuity: see VS, sees eye doctor q year 9. Counseling: provided 10. Labs ordered based on risk factors: if needed  11. Referral Coordination: if needed 12. Care Plan, see assessment and plan , written personalized plan provided , see AVS 13. Cognitive Assessment: motor skills and cognition appropriate for age 72. Care team updated - sees a cardiologist in [place] q year 3. End-of-life care discussed   In addition, today we discussed the following: Prediabetes: Doing well with lifestyle Hyperlipidemia: On no medications mild anemia, due for a CBC. Not on iron   Review of Systems Constitutional: No fever. No chills. No unexplained wt changes. No unusual sweats  HEENT: No dental problems, no ear discharge, no facial swelling, no voice changes. No eye discharge, no eye  redness , no  intolerance to light   Respiratory: No wheezing , no  difficulty breathing. No cough , no mucus production  Cardiovascular: No CP, no leg swelling , no  Palpitations  GI: no nausea, no vomiting, no diarrhea , no  abdominal pain.  No blood in the stools. No dysphagia, no odynophagia    Endocrine: No polyphagia, no polyuria , no polydipsia  GU: No dysuria, gross hematuria, difficulty urinating. No urinary urgency, no frequency.  Musculoskeletal: No joint swellings or unusual aches or pains  Skin: No change in the color of the skin, palor , no   Rash  Allergic, immunologic: No environmental allergies , no  food allergies  Neurological: No dizziness no  syncope. No headaches. No diplopia, no slurred, no slurred speech, no motor deficits, no facial  Numbness  Hematological: No enlarged lymph nodes, no easy bruising , no unusual bleedings  Psychiatry: No suicidal ideas, no hallucinations, no beavior problems, no confusion.  No unusual/severe anxiety, no depression    Past Medical History:  Diagnosis Date  . Allergic rhinitis   . HSV infection    L buttocks  . HYPERGLYCEMIA, BORDERLINE 01/23/2010  . Menopause 2005  . Osteopenia   . Palpitation    cardiac ablation secondary to palptiation done aprox 2000- now Asx    Past Surgical History:  Procedure Laterality Date  . bilateral bunionectomy  2002  . CARDIAC ELECTROPHYSIOLOGY MAPPING AND ABLATION  around 2000      . FOOT SURGERY Left 03/2013   bunion/hammer toe  . MOUTH SURGERY  03-2012   implants    Social History   Social History  . Marital status: Divorced    Spouse name: N/A  . Number of children: 2  . Years of education: N/A   Occupational History  . 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 Smoking status: Never Smoker  . Smokeless tobacco: Never Used  . Alcohol use Yes     Comment: occasional  .  Drug use: No  . Sexual activity: Not on file   Other Topics Concern  . Not on file   Social History Narrative   2 sons, they are in [place] the other in [place]-- pt                     Family History  Problem Relation Age of Onset  . Hypertension Mother   . Lung cancer Mother   . Diabetes Other     G parents   . Throat cancer Brother   . Coronary artery disease Neg Hx   . Stroke Neg Hx   . Colon cancer Neg Hx   . Breast cancer Neg Hx   . Ovarian cancer Neg Hx   . Uterine cancer Neg Hx   . Stomach cancer Neg Hx        Medication List        Accurate as of 05/22/16 11:59 PM. Always use your most recent med list.          calcium-vitamin D 250-125 MG-UNIT tablet Commonly known as:  OSCAL WITH D Take 1 tablet by mouth daily.   clotrimazole-betamethasone cream Commonly known as:  LOTRISONE Apply topically 2 (two) times daily.   COCONUT OIL PO Take 1 tablet by mouth daily.   COD LIVER OIL PO Take by mouth.   GARLIC-PARSLEY PO Take by mouth.   Ginkgo Biloba 60 MG Tabs Take by mouth.   Magnesium 250 MG Tabs Take 1 tablet by mouth daily.   multivitamin per tablet Take 1 tablet by mouth daily.   valACYclovir 500 MG tablet Commonly known as:  VALTREX Take 1 tablet (500 mg total) by mouth 2 (two) times daily as needed.   vitamin C 500 MG tablet Commonly known as:  ASCORBIC ACID Take 500 mg by mouth daily.          Objective:   Physical Exam BP 118/64 (BP Location: Left Arm, Patient Position: Sitting, Cuff Size: Normal)   Pulse 75   Temp 97.6 F (36.4 C) (Oral)   Resp 14   Ht 5\' 6"  (1.676 m)   Wt 157 lb (71.2 kg)   SpO2 94%   BMI 25.34 kg/m  General:   Well developed, well nourished . NAD.  Neck: No  thyromegaly  HEENT:  Normocephalic . Face symmetric, atraumatic Lungs:  CTA B Normal respiratory effort, no intercostal retractions, no accessory muscle use. Heart: RRR,  no murmur.  No pretibial edema bilaterally  Abdomen:  Not distended, soft, non-tender. No rebound or rigidity.   Skin: Exposed areas without rash. Not pale. Not jaundice Neurologic:  alert & oriented X3.  Speech normal, gait appropriate for age and unassisted Strength symmetric and appropriate for age.  Psych: Cognition and judgment appear intact.  Cooperative with normal attention span and concentration.  Behavior appropriate. No anxious or depressed appearing.     Assessment & Plan:   Assessment> Prediabetes Hyperlipidemia Cardiac ablation due to palpitations ~ 2000: now asx Mild anemia 2015, normal  iron Osteopenia -- T score -1.5  (2014) Rash (prn  lotrisone, groin) HSV infection, left buttock  PLAN: Prediabetes: Diet control, check an A1c Hyperlipidemia: On OTC, has a healthy lifestyle, check labs Mild anemia: Not taking iron, check a CBC Osteopenia: She remains very active, takes calcium and vitamin D. Check vitamin D levels, no DEXA this year. RTC one year

## 2016-05-22 NOTE — Patient Instructions (Signed)
Get your blood work before you leave   Think about a health care power of attorney  Next visit in one year, physical exam.   Fall Prevention and Home Safety Falls cause injuries and can affect all age groups. It is possible to use preventive measures to significantly decrease the likelihood of falls. There are many simple measures which can make your home safer and prevent falls. OUTDOORS  Repair cracks and edges of walkways and driveways.  Remove high doorway thresholds.  Trim shrubbery on the main path into your home.  Have good outside lighting.  Clear walkways of tools, rocks, debris, and clutter.  Check that handrails are not broken and are securely fastened. Both sides of steps should have handrails.  Have leaves, snow, and ice cleared regularly.  Use sand or salt on walkways during winter months.  In the garage, clean up grease or oil spills. BATHROOM  Install night lights.  Install grab bars by the toilet and in the tub and shower.  Use non-skid mats or decals in the tub or shower.  Place a plastic non-slip stool in the shower to sit on, if needed.  Keep floors dry and clean up all water on the floor immediately.  Remove soap buildup in the tub or shower on a regular basis.  Secure bath mats with non-slip, double-sided rug tape.  Remove throw rugs and tripping hazards from the floors. BEDROOMS  Install night lights.  Make sure a bedside light is easy to reach.  Do not use oversized bedding.  Keep a telephone by your bedside.  Have a firm chair with side arms to use for getting dressed.  Remove throw rugs and tripping hazards from the floor. KITCHEN  Keep handles on pots and pans turned toward the center of the stove. Use back burners when possible.  Clean up spills quickly and allow time for drying.  Avoid walking on wet floors.  Avoid hot utensils and knives.  Position shelves so they are not too high or low.  Place commonly used objects  within easy reach.  If necessary, use a sturdy step stool with a grab bar when reaching.  Keep electrical cables out of the way.  Do not use floor polish or wax that makes floors slippery. If you must use wax, use non-skid floor wax.  Remove throw rugs and tripping hazards from the floor. STAIRWAYS  Never leave objects on stairs.  Place handrails on both sides of stairways and use them. Fix any loose handrails. Make sure handrails on both sides of the stairways are as long as the stairs.  Check carpeting to make sure it is firmly attached along stairs. Make repairs to worn or loose carpet promptly.  Avoid placing throw rugs at the top or bottom of stairways, or properly secure the rug with carpet tape to prevent slippage. Get rid of throw rugs, if possible.  Have an electrician put in a light switch at the top and bottom of the stairs. OTHER FALL PREVENTION TIPS  Wear low-heel or rubber-soled shoes that are supportive and fit well. Wear closed toe shoes.  When using a stepladder, make sure it is fully opened and both spreaders are firmly locked. Do not climb a closed stepladder.  Add color or contrast paint or tape to grab bars and handrails in your home. Place contrasting color strips on first and last steps.  Learn and use mobility aids as needed. Install an electrical emergency response system.  Turn on lights to avoid  dark areas. Replace light bulbs that burn out immediately. Get light switches that glow.  Arrange furniture to create clear pathways. Keep furniture in the same place.  Firmly attach carpet with non-skid or double-sided tape.  Eliminate uneven floor surfaces.  Select a carpet pattern that does not visually hide the edge of steps.  Be aware of all pets. OTHER HOME SAFETY TIPS  Set the water temperature for 120 F (48.8 C).  Keep emergency numbers on or near the telephone.  Keep smoke detectors on every level of the home and near sleeping  areas. Document Released: 05/30/2002 Document Revised: 12/09/2011 Document Reviewed: 08/29/2011 [facility] Patient Information 2015 [place], [place]. This information is not intended to replace advice given to you by your health care provider. Make sure you discuss any questions you have with your health care provider.   Preventive Care for Adults Ages 65 and over  Blood pressure check.** / Every 1 to 2 years.  Lipid and cholesterol check.**/ Every 5 years beginning at age 3.  Lung cancer screening. / Every year if you are aged 95-80 years and have a 30-pack-year history of smoking and currently smoke or have quit within the past 15 years. Yearly screening is stopped once you have quit smoking for at least 15 years or develop a health problem that would prevent you from having lung cancer treatment.  Fecal occult blood test (FOBT) of stool. / Every year beginning at age 18 and continuing until age 31. You may not have to do this test if you get a colonoscopy every 10 years.  Flexible sigmoidoscopy** or colonoscopy.** / Every 5 years for a flexible sigmoidoscopy or every 10 years for a colonoscopy beginning at age 58 and continuing until age 65.  Hepatitis C blood test.** / For all people born from [birth year] through 1965 and any individual with known risks for hepatitis C.  Abdominal aortic aneurysm (AAA) screening.** / A one-time screening for ages 41 to 30 years who are current or former smokers.  Skin self-exam. / Monthly.  Influenza vaccine. / Every year.  Tetanus, diphtheria, and acellular pertussis (Tdap/Td) vaccine.** / 1 dose of Td every 10 years.  Varicella vaccine.** / Consult your health care provider.  Zoster vaccine.** / 1 dose for adults aged 62 years or older.  Pneumococcal 13-valent conjugate (PCV13) vaccine.** / Consult your health care provider.  Pneumococcal polysaccharide (PPSV23) vaccine.** / 1 dose for all adults aged 50 years and older.  Meningococcal vaccine.** /  Consult your health care provider.  Hepatitis A vaccine.** / Consult your health care provider.  Hepatitis B vaccine.** / Consult your health care provider.  Haemophilus influenzae type b (Hib) vaccine.** / Consult your health care provider. **Family history and personal history of risk and conditions may change your health care provider's recommendations. Document Released: 08/05/2001 Document Revised: 06/14/2013 Document Reviewed: 11/04/2010 [facility] Patient Information 2015 [place], [place]. This information is not intended to replace advice given to you by your health care provider. Make sure you discuss any questions you have with your health care provider.

## 2016-05-24 NOTE — Assessment & Plan Note (Signed)
Prediabetes: Diet control, check an A1c Hyperlipidemia: On OTC, has a healthy lifestyle, check labs Mild anemia: Not taking iron, check a CBC Osteopenia: She remains very active, takes calcium and vitamin D. Check vitamin D levels, no DEXA this year. RTC one year

## 2016-05-25 LAB — VITAMIN D 1,25 DIHYDROXY
Vitamin D 1, 25 (OH)2 Total: 33 pg/mL (ref 18–72)
Vitamin D2 1, 25 (OH)2: <8 pg/mL
Vitamin D3 1, 25 (OH)2: 33 pg/mL

## 2016-05-27 MED ORDER — ATORVASTATIN CALCIUM 10 MG PO TABS: 10.0000 mg | ORAL_TABLET | Freq: Every day | ORAL | 3 refills | Status: DC

## 2016-05-27 NOTE — Addendum Note (Signed)
Addended byDamita Dunnings D on: 05/27/2016 04:33 PM   Modules accepted: Orders

## 2016-07-08 ENCOUNTER — Ambulatory Visit: Payer: Self-pay

## 2016-07-24 ENCOUNTER — Ambulatory Visit
Admission: RE | Admit: 2016-07-24 | Discharge: 2016-07-24 | Disposition: A | Discharge status: Home / Self Care | Payer: Medicare Other | Source: Ambulatory Visit | Attending: Internal Medicine | Admitting: Internal Medicine

## 2016-07-24 DIAGNOSIS — Z1231 Encounter for screening mammogram for malignant neoplasm of breast: Secondary | ICD-10-CM | POA: Diagnosis not present

## 2016-07-28 ENCOUNTER — Other Ambulatory Visit (INDEPENDENT_AMBULATORY_CARE_PROVIDER_SITE_OTHER): Payer: Medicare Other

## 2016-07-28 DIAGNOSIS — E785 Hyperlipidemia, unspecified: Secondary | ICD-10-CM | POA: Diagnosis not present

## 2016-07-28 LAB — LIPID PANEL
Cholesterol: 145 mg/dL (ref 0–200)
HDL: 61.8 mg/dL (ref >39.00)
LDL Cholesterol: 68 mg/dL (ref 0–99)
NonHDL: 83.17
Total CHOL/HDL Ratio: 2
Triglycerides: 75 mg/dL (ref 0.0–149.0)
VLDL: 15 mg/dL (ref 0.0–40.0)

## 2016-07-28 LAB — AST: AST: 32 U/L (ref 0–37)

## 2016-07-28 LAB — ALT: ALT: 43 U/L — ABNORMAL HIGH (ref 0–35)

## 2016-08-19 DIAGNOSIS — H2513 Age-related nuclear cataract, bilateral: Secondary | ICD-10-CM | POA: Diagnosis not present

## 2016-08-19 DIAGNOSIS — H04123 Dry eye syndrome of bilateral lacrimal glands: Secondary | ICD-10-CM | POA: Diagnosis not present

## 2016-08-19 DIAGNOSIS — H524 Presbyopia: Secondary | ICD-10-CM | POA: Diagnosis not present

## 2016-10-25 ENCOUNTER — Encounter: Payer: Self-pay | Admitting: Internal Medicine

## 2016-10-27 MED ORDER — ATORVASTATIN CALCIUM 10 MG PO TABS: 10.0000 mg | ORAL_TABLET | Freq: Every day | ORAL | 5 refills | Status: DC

## 2016-10-31 ENCOUNTER — Other Ambulatory Visit (INDEPENDENT_AMBULATORY_CARE_PROVIDER_SITE_OTHER): Payer: Medicare Other

## 2016-10-31 DIAGNOSIS — E785 Hyperlipidemia, unspecified: Secondary | ICD-10-CM

## 2016-10-31 DIAGNOSIS — R7989 Other specified abnormal findings of blood chemistry: Secondary | ICD-10-CM

## 2016-10-31 DIAGNOSIS — R945 Abnormal results of liver function studies: Principal | ICD-10-CM

## 2016-10-31 LAB — ALT: ALT: 76 U/L — ABNORMAL HIGH (ref 0–35)

## 2016-10-31 LAB — AST: AST: 53 U/L — ABNORMAL HIGH (ref 0–37)

## 2017-01-13 NOTE — Telephone Encounter (Signed)
Pt returned call, pt would also like to have tetanus shot.    Scheduled pt for 01/20/17.

## 2017-01-13 NOTE — Telephone Encounter (Signed)
Pt due for TD, 11/2016, okay to schedule nurse visit/lab appt if not already.

## 2017-01-20 ENCOUNTER — Ambulatory Visit (INDEPENDENT_AMBULATORY_CARE_PROVIDER_SITE_OTHER): Payer: Medicare Other | Admitting: Behavioral Health

## 2017-01-20 ENCOUNTER — Other Ambulatory Visit (INDEPENDENT_AMBULATORY_CARE_PROVIDER_SITE_OTHER): Payer: Medicare Other

## 2017-01-20 DIAGNOSIS — Z23 Encounter for immunization: Secondary | ICD-10-CM

## 2017-01-20 DIAGNOSIS — R7989 Other specified abnormal findings of blood chemistry: Secondary | ICD-10-CM

## 2017-01-20 DIAGNOSIS — R945 Abnormal results of liver function studies: Principal | ICD-10-CM

## 2017-01-20 LAB — HEPATIC FUNCTION PANEL
ALT: 40 U/L — ABNORMAL HIGH (ref 0–35)
AST: 31 U/L (ref 0–37)
Albumin: 3.6 g/dL (ref 3.5–5.2)
Alkaline Phosphatase: 48 U/L (ref 39–117)
Bilirubin, Direct: 0.1 mg/dL (ref 0.0–0.3)
Total Bilirubin: 0.4 mg/dL (ref 0.2–1.2)
Total Protein: 7.1 g/dL (ref 6.0–8.3)

## 2017-01-20 NOTE — Progress Notes (Signed)
Pre visit review using our clinic review tool, if applicable. No additional management support is needed unless otherwise documented below in the visit note.  Patient came in clinic for Tdap vaccination. RN received verbal order from Dr. Larose Kells. IM injection was given in the left deltoid. Patient tolerated injection well.

## 2017-01-21 ENCOUNTER — Other Ambulatory Visit: Payer: Self-pay | Admitting: Internal Medicine

## 2017-03-08 ENCOUNTER — Other Ambulatory Visit: Payer: Self-pay | Admitting: Internal Medicine

## 2017-03-09 MED ORDER — CLOTRIMAZOLE-BETAMETHASONE 1-0.05 % EX CREA: TOPICAL_CREAM | Freq: Two times a day (BID) | CUTANEOUS | 0 refills | Status: DC | PRN

## 2017-04-01 ENCOUNTER — Encounter: Payer: Self-pay | Admitting: Internal Medicine

## 2017-04-03 NOTE — Telephone Encounter (Signed)
Patient scheduled office visit for 04/10/17 regarding the concerns mentioned below. Patient sent my chart message regarding  Appointment Request From: Alyssa Murray    With Provider: Kathlene November, MD [facility]]    Preferred Date Range: From 04/09/2017 To 04/15/2017    Preferred Times: Any    Reason: To address the following health maintenance concerns.  Influenza Vaccine  Pna Vac Low Risk Adult     please advise if flu shot and PNA Vac can be done at this time.

## 2017-04-03 NOTE — Telephone Encounter (Signed)
More than likely can be given at this time.

## 2017-04-06 NOTE — Telephone Encounter (Signed)
Patient scheduled with PCP 04/10/2017

## 2017-04-09 ENCOUNTER — Other Ambulatory Visit: Payer: Self-pay

## 2017-04-10 ENCOUNTER — Ambulatory Visit (INDEPENDENT_AMBULATORY_CARE_PROVIDER_SITE_OTHER): Payer: Medicare Other | Admitting: Internal Medicine

## 2017-04-10 ENCOUNTER — Encounter: Payer: Self-pay | Admitting: Internal Medicine

## 2017-04-10 VITALS — BP 124/60 | HR 62 | Temp 98.0°F | Resp 14 | Ht 66.0 in | Wt 159.4 lb

## 2017-04-10 DIAGNOSIS — E785 Hyperlipidemia, unspecified: Secondary | ICD-10-CM | POA: Diagnosis not present

## 2017-04-10 DIAGNOSIS — Z23 Encounter for immunization: Secondary | ICD-10-CM | POA: Diagnosis not present

## 2017-04-10 DIAGNOSIS — N644 Mastodynia: Secondary | ICD-10-CM

## 2017-04-10 NOTE — Progress Notes (Signed)
Subjective:    Patient ID: Alyssa Murray, female    DOB: Nov 29, 1950, 66 y.o.   MRN: [id number]  DOS:  04/10/2017 Type of visit - description : acute Interval history: One-month history of soreness at the right breast, no lump on self-examination. Also , regards high chol: She stopped Lipitor as instructed  Review of Systems She denies any injury. No nipple discharge. No rash.  Past Medical History:  Diagnosis Date  . Allergic rhinitis   . HSV infection    L buttocks  . HYPERGLYCEMIA, BORDERLINE 01/23/2010  . Menopause 2005  . Osteopenia   . Palpitation    cardiac ablation secondary to palptiation done aprox 2000- now Asx    Past Surgical History:  Procedure Laterality Date  . bilateral bunionectomy  2002  . CARDIAC ELECTROPHYSIOLOGY MAPPING AND ABLATION  around 2000      . FOOT SURGERY Left 03/2013   bunion/hammer toe  . MOUTH SURGERY  03-2012   implants    Social History   Social History  . Marital status: Divorced    Spouse name: N/A  . Number of children: 2  . Years of education: N/A   Occupational History  . 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 RETIRED  receptionist at [employer]  . Smoking status: Never Smoker  . Smokeless tobacco: Never Used  . Alcohol use Yes     Comment: occasional  . Drug use: No  . Sexual activity: Not on file   Other Topics Concern  . Not on file   Social History Narrative   2 sons, they are in [place] the other in [place]-- pt                      Allergies as of 04/10/2017   No Known Allergies     Medication List       Accurate as of 04/10/17 11:59 PM. Always use your most recent med list.          calcium-vitamin D 250-125 MG-UNIT tablet Commonly known as:  OSCAL WITH D Take 1 tablet by mouth daily.   COCONUT OIL PO Take 1 tablet by mouth daily.   COD LIVER OIL PO Take by mouth.   GARLIC-PARSLEY PO Take by mouth.   KLS ALLERCLEAR 10 MG  tablet Generic drug:  loratadine Take 1 tablet by mouth daily as needed.   Magnesium 250 MG Tabs Take 1 tablet by mouth daily.   multivitamin per tablet Take 1 tablet by mouth daily.   valACYclovir 500 MG tablet Commonly known as:  VALTREX Take 1 tablet (500 mg total) by mouth 2 (two) times daily as needed.   vitamin C 500 MG tablet Commonly known as:  ASCORBIC ACID Take 500 mg by mouth daily.          Objective:   Physical Exam BP 124/60 (BP Location: Left Arm, Patient Position: Sitting, Cuff Size: Small)   Pulse 62   Temp 98 F (36.7 C) (Oral)   Resp 14   Ht 5\' 6"  (1.676 m)   Wt 159 lb 6 oz (72.3 kg)   SpO2 98%   BMI 25.72 kg/m  General:   Well developed, well nourished . NAD.  HEENT:  Normocephalic . Face symmetric, atraumatic  Neck: No LAD Breast: no dominant mass, skin and nipples normal to inspection on palpation, axillary areas without mass or  lymphadenopathy. Specifically, the area of tenderness is at the R breast 6:00, normal to inspection on palpation Skin: Not pale. Not jaundice Neurologic:  alert & oriented X3.  Speech normal, gait appropriate for age and unassisted Psych--  Cognition and judgment appear intact.  Cooperative with normal attention span and concentration.  Behavior appropriate. No anxious or depressed appearing.      Assessment & Plan:   Assessment Prediabetes Hyperlipidemia: rx lipitor 04-2016, good response, d/c 5/2-18 d/t elevated LFTs Cardiac ablation due to palpitations ~ 2000: now asx Mild anemia 2015, normal iron Osteopenia -- T score -1.5  (2014) Rash (prn  lotrisone, groin) HSV infection, left buttock  PLAN: Mastalgia: No lump on self-examination or on clinical exam. Up-to-date on mammograms. Rec observation, continue self breast exam monthly, call for any abnormal SBE. High cholesterol: Lipitor increased LFTs. Reassess in RTC, encourage a healthy diet. Primary care: Pneumvax 23 today flu shot today.  RTC 05-2017  as schedule

## 2017-04-10 NOTE — Patient Instructions (Signed)
See you in December  Continue usual self breast examination  If you have severe pain at the brest please call   If you feel a lump, you see a rash or a nipple  discharge please let me know  Start taking vitamin E over-the-counter.

## 2017-04-10 NOTE — Progress Notes (Signed)
Pre visit review using our clinic review tool, if applicable. No additional management support is needed unless otherwise documented below in the visit note. 

## 2017-04-12 NOTE — Assessment & Plan Note (Signed)
Mastalgia: No lump on self-examination or on clinical exam. Up-to-date on mammograms. Rec observation, continue self breast exam monthly, call for any abnormal SBE. High cholesterol: Lipitor increased LFTs. Reassess in RTC, encourage a healthy diet. Primary care: Pneumvax 23 today flu shot today.  RTC 05-2017 as schedule

## 2017-05-26 ENCOUNTER — Other Ambulatory Visit: Payer: Self-pay

## 2017-05-27 ENCOUNTER — Encounter: Payer: Self-pay | Admitting: Internal Medicine

## 2017-05-27 ENCOUNTER — Ambulatory Visit (INDEPENDENT_AMBULATORY_CARE_PROVIDER_SITE_OTHER): Payer: Medicare Other | Admitting: Internal Medicine

## 2017-05-27 VITALS — BP 116/68 | HR 75 | Temp 98.1°F | Resp 14 | Ht 66.0 in | Wt 159.1 lb

## 2017-05-27 DIAGNOSIS — Z780 Asymptomatic menopausal state: Secondary | ICD-10-CM

## 2017-05-27 DIAGNOSIS — Z809 Family history of malignant neoplasm, unspecified: Secondary | ICD-10-CM | POA: Diagnosis not present

## 2017-05-27 DIAGNOSIS — E785 Hyperlipidemia, unspecified: Secondary | ICD-10-CM | POA: Diagnosis not present

## 2017-05-27 DIAGNOSIS — M8580 Other specified disorders of bone density and structure, unspecified site: Secondary | ICD-10-CM | POA: Diagnosis not present

## 2017-05-27 DIAGNOSIS — Z9889 Other specified postprocedural states: Secondary | ICD-10-CM

## 2017-05-27 DIAGNOSIS — R739 Hyperglycemia, unspecified: Secondary | ICD-10-CM

## 2017-05-27 LAB — COMPREHENSIVE METABOLIC PANEL
ALT: 21 U/L (ref 0–35)
AST: 22 U/L (ref 0–37)
Albumin: 4 g/dL (ref 3.5–5.2)
Alkaline Phosphatase: 48 U/L (ref 39–117)
BUN: 9 mg/dL (ref 6–23)
CO2: 27 mEq/L (ref 19–32)
Calcium: 10 mg/dL (ref 8.4–10.5)
Chloride: 104 mEq/L (ref 96–112)
Creatinine, Ser: 0.8 mg/dL (ref 0.40–1.20)
GFR: 92.16 mL/min (ref >60.00)
Glucose, Bld: 90 mg/dL (ref 70–99)
Potassium: 4.2 mEq/L (ref 3.5–5.1)
Sodium: 138 mEq/L (ref 135–145)
Total Bilirubin: 0.6 mg/dL (ref 0.2–1.2)
Total Protein: 7.8 g/dL (ref 6.0–8.3)

## 2017-05-27 LAB — HEMOGLOBIN A1C: Hgb A1c MFr Bld: 6.3 % (ref 4.6–6.5)

## 2017-05-27 LAB — LIPID PANEL
Cholesterol: 198 mg/dL (ref 0–200)
HDL: 63.5 mg/dL (ref >39.00)
LDL Cholesterol: 120 mg/dL — ABNORMAL HIGH (ref 0–99)
NonHDL: 134.29
Total CHOL/HDL Ratio: 3
Triglycerides: 69 mg/dL (ref 0.0–149.0)
VLDL: 13.8 mg/dL (ref 0.0–40.0)

## 2017-05-27 LAB — TSH: TSH: 2.36 u[IU]/mL (ref 0.35–4.50)

## 2017-05-27 NOTE — Assessment & Plan Note (Signed)
Preventive care discussed Prediabetes: We talked about diet and exercise, check A1c Hyperlipidemia: Currently on diet control, she remains physically active, check FLP, CMP H/o cardiac ablation: pt is asx,   EKG is rhythm, low voltage, slightly different from previous, could be lead position (d/w cards DOD, EKG showed new ICRBBB, not a major problem) pt asx Osteopenia: On calcium, vitamin D, recheck a bone density test. Mastalgia: Improved, SBE remains normal, taking vitamin D RTC 12  months

## 2017-05-27 NOTE — Assessment & Plan Note (Addendum)
Td 2018; zostavax 11-2011; prevnar 9-17, pnm 23 2018, had a flu shot; shingrex discussed -female care:  MMG 07-2016, clinical breast exam (-) 03-2017 Multiple PAPs before, never had an abnormal one, last PAP-HPV (-) 11-Jun-2015.  Mother recently deceased from apparently endometrial cancer.  Refer to gynecology. -[facility]: Colonoscopy --06/23/2000, in [place] , reportedly normal cscope 03-2011, polyps, tics, 10 years, Dr Carlean Purl  -Diet -exercise discussed

## 2017-05-27 NOTE — Progress Notes (Signed)
Subjective:    Patient ID: Alyssa Murray, female    DOB: 08/28/1950, 66 y.o.   MRN: [id number]  DOS:  05/27/2017 Type of visit - description : rov Interval history: Was seen with mastalgia the last visit, symptoms decreased, taking vitamin E. Concerned about her mother, she died a few months ago from a pelvic tumor, apparently endometrial cancer.  Patient would like further eval. History of a cardiac ablation: Due for EKG Osteopenia: Due for a bone density test, on calcium and vitamin D High cholesterol: Due for labs   Review of Systems + Distress due to losing her mother and taking care of a brother with cancer.  She has been very busy, fortunately no anxiety or depression per se. No chest pain, difficulty breathing, lower extremity edema or palpitations No nausea, vomiting, diarrhea Denies vaginal discharge or bleeding  Past Medical History:  Diagnosis Date  . Allergic rhinitis   . HSV infection    L buttocks  . HYPERGLYCEMIA, BORDERLINE 01/23/2010  . Menopause 2005  . Osteopenia   . Palpitation    cardiac ablation secondary to palptiation done aprox 2000- now Asx    Past Surgical History:  Procedure Laterality Date  . bilateral bunionectomy  2002  . CARDIAC ELECTROPHYSIOLOGY MAPPING AND ABLATION  around 2000      . FOOT SURGERY Left 03/2013   bunion/hammer toe  . MOUTH SURGERY  03-2012   implants    Social History   Socioeconomic History  . Marital status: Divorced    Spouse name: Not on file  . Number of children: 2  . Years of education: Not on file  . Highest education level: Not on file  Social Needs  . Financial resource strain: Not on file  . Food insecurity - worry: Not on file  . Food insecurity - inability: Not on file  . Transportation needs - medical: Not on file  . Transportation needs - non-medical: Not on file  Occupational History  . 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 Occupation: RETIRED  Research scientist (physical sciences) at [employer]: [employer]  Tobacco Use  . Smoking status: Never Smoker  . Smokeless tobacco: Never Used  Substance and Sexual Activity  . Alcohol use: Yes    Comment: occasional  . Drug use: No  . Sexual activity: Not on file  Other Topics Concern  . Not on file  Social History Narrative   2 sons, they are in [place] the other in [place]-- pt                Family History  Problem Relation Age of Onset  . Hypertension Mother   . Lung cancer Mother   . Endometrial cancer Mother        question off  . Diabetes Other        G parents   . Throat cancer Brother   . Coronary artery disease Neg Hx   . Stroke Neg Hx   . Colon cancer Neg Hx   . Breast cancer Neg Hx   . Ovarian cancer Neg Hx   . Stomach cancer Neg Hx      Allergies as of 05/27/2017   No Known Allergies     Medication List        Accurate as of 05/27/17  5:08 PM. Always use your most recent med list.          calcium-vitamin D 250-125 MG-UNIT tablet  Commonly known as:  OSCAL WITH D Take 1 tablet by mouth daily.   COCONUT OIL PO Take 1 tablet by mouth daily.   COD LIVER OIL PO Take by mouth.   GARLIC-PARSLEY PO Take by mouth.   KLS ALLERCLEAR 10 MG tablet Generic drug:  loratadine Take 1 tablet by mouth daily as needed.   Magnesium 250 MG Tabs Take 1 tablet by mouth daily.   multivitamin per tablet Take 1 tablet by mouth daily.   valACYclovir 500 MG tablet Commonly known as:  VALTREX Take 1 tablet (500 mg total) by mouth 2 (two) times daily as needed.   vitamin C 500 MG tablet Commonly known as:  ASCORBIC ACID Take 500 mg by mouth daily.   vitamin E 400 UNIT capsule Take 400 Units by mouth daily.          Objective:   Physical Exam BP 116/68 (BP Location: Left Arm, Patient Position: Sitting, Cuff Size: Small)   Pulse 75   Temp 98.1 F (36.7 C) (Oral)   Resp 14   Ht 5\' 6"  (1.676 m)   Wt 159 lb 2 oz (72.2 kg)   SpO2 94%   BMI 25.68 kg/m  General:   Well  developed, well nourished . NAD.  Neck: No  thyromegaly  HEENT:  Normocephalic . Face symmetric, atraumatic Lungs:  CTA B Normal respiratory effort, no intercostal retractions, no accessory muscle use. Heart: RRR,  no murmur.  No pretibial edema bilaterally  Abdomen:  Not distended, soft, non-tender. No rebound or rigidity.   Skin: Exposed areas without rash. Not pale. Not jaundice Neurologic:  alert & oriented X3.  Speech normal, gait appropriate for age and unassisted Strength symmetric and appropriate for age.  Psych: Cognition and judgment appear intact.  Cooperative with normal attention span and concentration.  Behavior appropriate. No anxious or depressed appearing.     Assessment & Plan:   Assessment Prediabetes Hyperlipidemia: rx lipitor 04-2016, good response, d/c 5/2-18 d/t elevated LFTs Cardiac ablation due to palpitations ~ 2000: now asx Mild anemia 2015, normal iron Osteopenia -- T score -1.5  (2014), nl vit D 2017 Rash (prn  lotrisone, groin) HSV infection, left buttock  PLAN: Preventive care discussed Prediabetes: We talked about diet and exercise, check A1c Hyperlipidemia: Currently on diet control, she remains physically active, check FLP, CMP H/o cardiac ablation: pt is asx,   EKG is rhythm, low voltage, slightly different from previous, could be lead position (d/w cards DOD, EKG showed new ICRBBB, not a major problem) pt asx Osteopenia: On calcium, vitamin D, recheck a bone density test. Mastalgia: Improved, SBE remains normal, taking vitamin D RTC 12  months

## 2017-05-27 NOTE — Progress Notes (Signed)
Pre visit review using our clinic review tool, if applicable. No additional management support is needed unless otherwise documented below in the visit note. 

## 2017-05-27 NOTE — Patient Instructions (Addendum)
GO TO THE LAB : Get the blood work     GO TO THE FRONT DESK Schedule your next appointment for a checkup in 12 months  Consider [facility]  Consider Medicare wellness with one of our nurses   will refer you to a gynecologist

## 2017-06-22 ENCOUNTER — Other Ambulatory Visit: Payer: Self-pay | Admitting: Internal Medicine

## 2017-06-22 DIAGNOSIS — Z139 Encounter for screening, unspecified: Secondary | ICD-10-CM

## 2017-07-01 ENCOUNTER — Ambulatory Visit (INDEPENDENT_AMBULATORY_CARE_PROVIDER_SITE_OTHER): Payer: Medicare Other

## 2017-07-01 DIAGNOSIS — Z23 Encounter for immunization: Secondary | ICD-10-CM | POA: Diagnosis not present

## 2017-07-08 ENCOUNTER — Encounter: Payer: Self-pay | Admitting: Obstetrics & Gynecology

## 2017-07-08 ENCOUNTER — Other Ambulatory Visit (HOSPITAL_COMMUNITY)
Admission: RE | Admit: 2017-07-08 | Discharge: 2017-07-08 | Disposition: A | Discharge status: Home / Self Care | Payer: Medicare Other | Source: Ambulatory Visit | Attending: Obstetrics & Gynecology | Admitting: Obstetrics & Gynecology

## 2017-07-08 ENCOUNTER — Ambulatory Visit (INDEPENDENT_AMBULATORY_CARE_PROVIDER_SITE_OTHER): Payer: Medicare Other | Admitting: Obstetrics & Gynecology

## 2017-07-08 VITALS — BP 129/64 | HR 77 | Ht 67.0 in | Wt 160.0 lb

## 2017-07-08 DIAGNOSIS — Z01419 Encounter for gynecological examination (general) (routine) without abnormal findings: Secondary | ICD-10-CM | POA: Insufficient documentation

## 2017-07-08 NOTE — Patient Instructions (Signed)
Health Maintenance for Postmenopausal Women Menopause is a normal process in which your reproductive ability comes to an end. This process happens gradually over a span of months to years, usually between the ages of 22 and 9. Menopause is complete when you have missed 12 consecutive menstrual periods. It is important to talk with your health care provider about some of the most common conditions that affect postmenopausal women, such as heart disease, cancer, and bone loss (osteoporosis). Adopting a healthy lifestyle and getting preventive care can help to promote your health and wellness. Those actions can also lower your chances of developing some of these common conditions. What should I know about menopause? During menopause, you may experience a number of symptoms, such as:  Moderate-to-severe hot flashes.  Night sweats.  Decrease in sex drive.  Mood swings.  Headaches.  Tiredness.  Irritability.  Memory problems.  Insomnia.  Choosing to treat or not to treat menopausal changes is an individual decision that you make with your health care provider. What should I know about hormone replacement therapy and supplements? Hormone therapy products are effective for treating symptoms that are associated with menopause, such as hot flashes and night sweats. Hormone replacement carries certain risks, especially as you become older. If you are thinking about using estrogen or estrogen with progestin treatments, discuss the benefits and risks with your health care provider. What should I know about heart disease and stroke? Heart disease, heart attack, and stroke become more likely as you age. This may be due, in part, to the hormonal changes that your body experiences during menopause. These can affect how your body processes dietary fats, triglycerides, and cholesterol. Heart attack and stroke are both medical emergencies. There are many things that you can do to help prevent heart disease  and stroke:  Have your blood pressure checked at least every 1-2 years. High blood pressure causes heart disease and increases the risk of stroke.  If you are 53-22 years old, ask your health care provider if you should take aspirin to prevent a heart attack or a stroke.  Do not use any tobacco products, including cigarettes, chewing tobacco, or electronic cigarettes. If you need help quitting, ask your health care provider.  It is important to eat a healthy diet and maintain a healthy weight. ? Be sure to include plenty of vegetables, fruits, low-fat dairy products, and lean protein. ? Avoid eating foods that are high in solid fats, added sugars, or salt (sodium).  Get regular exercise. This is one of the most important things that you can do for your health. ? Try to exercise for at least 150 minutes each week. The type of exercise that you do should increase your heart rate and make you sweat. This is known as moderate-intensity exercise. ? Try to do strengthening exercises at least twice each week. Do these in addition to the moderate-intensity exercise.  Know your numbers.Ask your health care provider to check your cholesterol and your blood glucose. Continue to have your blood tested as directed by your health care provider.  What should I know about cancer screening? There are several types of cancer. Take the following steps to reduce your risk and to catch any cancer development as early as possible. Breast Cancer  Practice breast self-awareness. ? This means understanding how your breasts normally appear and feel. ? It also means doing regular breast self-exams. Let your health care provider know about any changes, no matter how small.  If you are 40  or older, have a clinician do a breast exam (clinical breast exam or CBE) every year. Depending on your age, family history, and medical history, it may be recommended that you also have a yearly breast X-ray (mammogram).  If you  have a family history of breast cancer, talk with your health care provider about genetic screening.  If you are at high risk for breast cancer, talk with your health care provider about having an MRI and a mammogram every year.  Breast cancer (BRCA) gene test is recommended for women who have family members with BRCA-related cancers. Results of the assessment will determine the need for genetic counseling and BRCA1 and for BRCA2 testing. BRCA-related cancers include these types: ? Breast. This occurs in males or females. ? Ovarian. ? Tubal. This may also be called fallopian tube cancer. ? Cancer of the abdominal or pelvic lining (peritoneal cancer). ? Prostate. ? Pancreatic.  Cervical, Uterine, and Ovarian Cancer Your health care provider may recommend that you be screened regularly for cancer of the pelvic organs. These include your ovaries, uterus, and vagina. This screening involves a pelvic exam, which includes checking for microscopic changes to the surface of your cervix (Pap test).  For women ages 21-65, health care providers may recommend a pelvic exam and a Pap test every three years. For women ages 79-65, they may recommend the Pap test and pelvic exam, combined with testing for human papilloma virus (HPV), every five years. Some types of HPV increase your risk of cervical cancer. Testing for HPV may also be done on women of any age who have unclear Pap test results.  Other health care providers may not recommend any screening for nonpregnant women who are considered low risk for pelvic cancer and have no symptoms. Ask your health care provider if a screening pelvic exam is right for you.  If you have had past treatment for cervical cancer or a condition that could lead to cancer, you need Pap tests and screening for cancer for at least 20 years after your treatment. If Pap tests have been discontinued for you, your risk factors (such as having a new sexual partner) need to be  reassessed to determine if you should start having screenings again. Some women have medical problems that increase the chance of getting cervical cancer. In these cases, your health care provider may recommend that you have screening and Pap tests more often.  If you have a family history of uterine cancer or ovarian cancer, talk with your health care provider about genetic screening.  If you have vaginal bleeding after reaching menopause, tell your health care provider.  There are currently no reliable tests available to screen for ovarian cancer.  Lung Cancer Lung cancer screening is recommended for adults 69-62 years old who are at high risk for lung cancer because of a history of smoking. A yearly low-dose CT scan of the lungs is recommended if you:  Currently smoke.  Have a history of at least 30 pack-years of smoking and you currently smoke or have quit within the past 15 years. A pack-year is smoking an average of one pack of cigarettes per day for one year.  Yearly screening should:  Continue until it has been 15 years since you quit.  Stop if you develop a health problem that would prevent you from having lung cancer treatment.  Colorectal Cancer  This type of cancer can be detected and can often be prevented.  Routine colorectal cancer screening usually begins at  age 42 and continues through age 45.  If you have risk factors for colon cancer, your health care provider may recommend that you be screened at an earlier age.  If you have a family history of colorectal cancer, talk with your health care provider about genetic screening.  Your health care provider may also recommend using home test kits to check for hidden blood in your stool.  A small camera at the end of a tube can be used to examine your colon directly (sigmoidoscopy or colonoscopy). This is done to check for the earliest forms of colorectal cancer.  Direct examination of the colon should be repeated every  5-10 years until age 71. However, if early forms of precancerous polyps or small growths are found or if you have a family history or genetic risk for colorectal cancer, you may need to be screened more often.  Skin Cancer  Check your skin from head to toe regularly.  Monitor any moles. Be sure to tell your health care provider: ? About any new moles or changes in moles, especially if there is a change in a mole's shape or color. ? If you have a mole that is larger than the size of a pencil eraser.  If any of your family members has a history of skin cancer, especially at a young age, talk with your health care provider about genetic screening.  Always use sunscreen. Apply sunscreen liberally and repeatedly throughout the day.  Whenever you are outside, protect yourself by wearing long sleeves, pants, a wide-brimmed hat, and sunglasses.  What should I know about osteoporosis? Osteoporosis is a condition in which bone destruction happens more quickly than new bone creation. After menopause, you may be at an increased risk for osteoporosis. To help prevent osteoporosis or the bone fractures that can happen because of osteoporosis, the following is recommended:  If you are 46-71 years old, get at least 1,000 mg of calcium and at least 600 mg of vitamin D per day.  If you are older than age 55 but younger than age 65, get at least 1,200 mg of calcium and at least 600 mg of vitamin D per day.  If you are older than age 54, get at least 1,200 mg of calcium and at least 800 mg of vitamin D per day.  Smoking and excessive alcohol intake increase the risk of osteoporosis. Eat foods that are rich in calcium and vitamin D, and do weight-bearing exercises several times each week as directed by your health care provider. What should I know about how menopause affects my mental health? Depression may occur at any age, but it is more common as you become older. Common symptoms of depression  include:  Low or sad mood.  Changes in sleep patterns.  Changes in appetite or eating patterns.  Feeling an overall lack of motivation or enjoyment of activities that you previously enjoyed.  Frequent crying spells.  Talk with your health care provider if you think that you are experiencing depression. What should I know about immunizations? It is important that you get and maintain your immunizations. These include:  Tetanus, diphtheria, and pertussis (Tdap) booster vaccine.  Influenza every year before the flu season begins.  Pneumonia vaccine.  Shingles vaccine.  Your health care provider may also recommend other immunizations. This information is not intended to replace advice given to you by your health care provider. Make sure you discuss any questions you have with your health care provider. Document Released: 08/01/2005  Document Revised: 12/28/2015 Document Reviewed: 03/13/2015 Elsevier Interactive Patient Education  2018 Else[employer] Inc[employer]  

## 2017-07-08 NOTE — Progress Notes (Signed)
Subjective:     Alyssa Murray is a 67 y.o. female here for a routine exam.  [id number]. LMP 15 years prev. Current complaints: No gyn.  No bleeding since that time. Pt has vasomotor sx that are maneable. No leakage of urine. Pt is sexually active   Pt is monogamous. Genital herpes- >10. On episodic meds. Pt reports an outbreak rarely last one >2 years prev.     Pts mother had a caner of the female organs- not sure what the primary was. She was 81 year.         Gynecologic History No LMP recorded. Patient is postmenopausal. Contraception: post menopausal status Last Pap: 2016. Results were: normal Last mammogram: 07/24/2016. Results were: normal  Obstetric History OB History  Gravida Para Term Preterm AB Living  3 2 2   1 2   SAB TAB Ectopic Multiple Live Births  1       2    # Outcome Date GA Lbr Len/2nd Weight Sex Delivery Anes PTL Lv  3 Term 12/02/88    M Vag-Spont   LIV  2 Term 02/15/85 [redacted]w[redacted]d   M Vag-Spont   LIV  1 SAB 06/23/72               The following portions of the patient's history were reviewed and updated as appropriate: allergies, current medications, past family history, past medical history, past social history, past surgical history and problem list.  Review of Systems Pertinent items are noted in HPI.    Objective:  BP 129/64   Pulse 77   Ht 5\' 7"  (1.702 m)   Wt 160 lb (72.6 kg)   BMI 25.06 kg/m  General Appearance:    Alert, cooperative, no distress, appears stated age  Head:    Normocephalic, without obvious abnormality, atraumatic  Eyes:    conjunctiva/corneas clear, EOM's intact, both eyes  Ears:    Normal external ear canals, both ears  Nose:   Nares normal, septum midline, mucosa normal, no drainage    or sinus tenderness  Throat:   Lips, mucosa, and tongue normal; teeth and gums normal  Neck:   Supple, symmetrical, trachea midline, no adenopathy;    thyroid:  no enlargement/tenderness/nodules  Back:     Symmetric, no curvature, ROM normal, no CVA  tenderness  Lungs:     Clear to auscultation bilaterally, respirations unlabored  Chest Wall:    No tenderness or deformity   Heart:    Regular rate and rhythm, S1 and S2 normal, no murmur, rub   or gallop  Breast Exam:    No tenderness, masses, or nipple abnormality  Abdomen:     Soft, non-tender, bowel sounds active all four quadrants,    no masses, no organomegaly  Genitalia:    Normal female without lesion, discharge or tenderness     Extremities:   Extremities normal, atraumatic, no cyanosis or edema  Pulses:   2+ and symmetric all extremities  Skin:   Skin color, texture, turgor normal, no rashes or lesions      Assessment:    Healthy female exam.   FH of genital tract malignancy- pt wants to cont routine PAPs at present  Plan:    Mammogram ordered. Follow up in: 1 year.    F/u PAP with hrHPV.   Chandler Swiderski L. Harraway-Smith, M.D., Cherlynn June

## 2017-07-10 LAB — CYTOLOGY - PAP
Diagnosis: NEGATIVE
HPV: NOT DETECTED

## 2017-08-03 ENCOUNTER — Ambulatory Visit
Admission: RE | Admit: 2017-08-03 | Discharge: 2017-08-03 | Disposition: A | Discharge status: Home / Self Care | Payer: Medicare Other | Source: Ambulatory Visit | Attending: Internal Medicine | Admitting: Internal Medicine

## 2017-08-03 DIAGNOSIS — Z1231 Encounter for screening mammogram for malignant neoplasm of breast: Secondary | ICD-10-CM | POA: Diagnosis not present

## 2017-08-03 DIAGNOSIS — Z139 Encounter for screening, unspecified: Secondary | ICD-10-CM

## 2017-09-17 ENCOUNTER — Telehealth: Payer: Self-pay | Admitting: *Deleted

## 2017-09-17 NOTE — Telephone Encounter (Signed)
Spoke with pt and schedule 2nd Shingrix vaccine for 09/25/17.

## 2017-09-21 ENCOUNTER — Other Ambulatory Visit: Payer: Self-pay | Admitting: Internal Medicine

## 2017-09-25 ENCOUNTER — Ambulatory Visit (INDEPENDENT_AMBULATORY_CARE_PROVIDER_SITE_OTHER): Payer: Medicare Other | Admitting: Emergency Medicine

## 2017-09-25 DIAGNOSIS — Z23 Encounter for immunization: Secondary | ICD-10-CM

## 2017-09-25 NOTE — Progress Notes (Signed)
Pre visit review using our clinic review tool, if applicable. No additional management support is needed unless otherwise documented below in the visit note. 

## 2017-10-14 DIAGNOSIS — H524 Presbyopia: Secondary | ICD-10-CM | POA: Diagnosis not present

## 2017-10-14 DIAGNOSIS — H43813 Vitreous degeneration, bilateral: Secondary | ICD-10-CM | POA: Diagnosis not present

## 2017-10-14 DIAGNOSIS — H2513 Age-related nuclear cataract, bilateral: Secondary | ICD-10-CM | POA: Diagnosis not present

## 2017-10-15 DIAGNOSIS — I456 Pre-excitation syndrome: Secondary | ICD-10-CM | POA: Diagnosis not present

## 2017-10-15 DIAGNOSIS — R7982 Elevated C-reactive protein (CRP): Secondary | ICD-10-CM | POA: Diagnosis not present

## 2017-10-15 DIAGNOSIS — I10 Essential (primary) hypertension: Secondary | ICD-10-CM | POA: Diagnosis not present

## 2017-10-15 DIAGNOSIS — R002 Palpitations: Secondary | ICD-10-CM | POA: Diagnosis not present

## 2017-10-15 DIAGNOSIS — E119 Type 2 diabetes mellitus without complications: Secondary | ICD-10-CM | POA: Diagnosis not present

## 2017-10-15 DIAGNOSIS — R079 Chest pain, unspecified: Secondary | ICD-10-CM | POA: Diagnosis not present

## 2017-10-15 DIAGNOSIS — E7800 Pure hypercholesterolemia, unspecified: Secondary | ICD-10-CM | POA: Diagnosis not present

## 2017-10-15 DIAGNOSIS — I349 Nonrheumatic mitral valve disorder, unspecified: Secondary | ICD-10-CM | POA: Diagnosis not present

## 2017-11-26 DIAGNOSIS — I456 Pre-excitation syndrome: Secondary | ICD-10-CM | POA: Diagnosis not present

## 2017-11-26 DIAGNOSIS — I369 Nonrheumatic tricuspid valve disorder, unspecified: Secondary | ICD-10-CM | POA: Diagnosis not present

## 2017-11-26 DIAGNOSIS — I349 Nonrheumatic mitral valve disorder, unspecified: Secondary | ICD-10-CM | POA: Diagnosis not present

## 2017-11-26 DIAGNOSIS — I379 Nonrheumatic pulmonary valve disorder, unspecified: Secondary | ICD-10-CM | POA: Diagnosis not present

## 2017-12-30 DIAGNOSIS — E7800 Pure hypercholesterolemia, unspecified: Secondary | ICD-10-CM | POA: Diagnosis not present

## 2017-12-30 DIAGNOSIS — I340 Nonrheumatic mitral (valve) insufficiency: Secondary | ICD-10-CM | POA: Diagnosis not present

## 2018-01-13 ENCOUNTER — Encounter: Payer: Self-pay | Admitting: Internal Medicine

## 2018-01-14 MED ORDER — ATORVASTATIN CALCIUM 10 MG PO TABS: 10.0000 mg | ORAL_TABLET | Freq: Every day | ORAL | 1 refills | Status: DC

## 2018-01-14 MED ORDER — VALACYCLOVIR HCL 500 MG PO TABS: 500.0000 mg | ORAL_TABLET | Freq: Two times a day (BID) | ORAL | 3 refills | Status: DC | PRN

## 2018-01-29 DIAGNOSIS — I456 Pre-excitation syndrome: Secondary | ICD-10-CM | POA: Diagnosis not present

## 2018-01-29 DIAGNOSIS — I340 Nonrheumatic mitral (valve) insufficiency: Secondary | ICD-10-CM | POA: Diagnosis not present

## 2018-01-29 DIAGNOSIS — E7800 Pure hypercholesterolemia, unspecified: Secondary | ICD-10-CM | POA: Diagnosis not present

## 2018-02-04 NOTE — Progress Notes (Signed)
Subjective:   Alyssa Murray is a 67 y.o. female who presents for Medicare Annual (Subsequent) preventive examination.  Review of Systems: No ROS.  Medicare Wellness Visit. Additional risk factors are reflected in the social history. Cardiac Risk Factors include: advanced age (>29men, >69 women);dyslipidemia Sleep patterns: no issues Home Safety/Smoke Alarms: Feels safe in home. Smoke alarms in place.  Living environment; residence and Firearm Safety: Lives in 2 town home. Lives alone.   Female:   Pap-    utd   Mammo-  utd     Dexa scan- active order       CCS- due 2022     Objective:     Vitals: BP 126/77 (BP Location: Right Arm, Patient Position: Sitting, Cuff Size: Normal)   Pulse 89   Wt 162 lb 9.6 oz (73.8 kg)   SpO2 96%   BMI 25.47 kg/m   Body mass index is 25.47 kg/m.  Advanced Directives 02/05/2018  Does Patient Have a Medical Advance Directive? No  Would patient like information on creating a medical advance directive? Yes (MAU/Ambulatory/Procedural Areas - Information given)    Tobacco Social History   Tobacco Use  Smoking Status Never Smoker  Smokeless Tobacco Never Used     Counseling given: Not Answered   Clinical Intake: Pain : No/denies pain  Past Medical History:  Diagnosis Date  . Allergic rhinitis   . HSV infection    L buttocks  . HYPERGLYCEMIA, BORDERLINE 01/23/2010  . Menopause 2005  . Osteopenia   . Palpitation    cardiac ablation secondary to palptiation done aprox 2000- now Asx   Past Surgical History:  Procedure Laterality Date  . bilateral bunionectomy  2002  . CARDIAC ELECTROPHYSIOLOGY MAPPING AND ABLATION  around 2000      . FOOT SURGERY Left 03/2013   bunion/hammer toe  . MOUTH SURGERY  03-2012   implants   Family History  Problem Relation Age of Onset  . Hypertension Mother   . Lung cancer Mother   . Endometrial cancer Mother        question off  . Diabetes Other        G parents   . Throat cancer Brother   .  Cancer Brother   . Coronary artery disease Neg Hx   . Stroke Neg Hx   . Colon cancer Neg Hx   . Breast cancer Neg Hx   . Ovarian cancer Neg Hx   . Stomach cancer Neg Hx    Social History   Socioeconomic History  . Marital status: Divorced    Spouse name: Not on file  . Number of children: 2  . Years of education: Not on file  . Highest education level: Not on file  Occupational History  . 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 Occupation: RETIRED  Research scientist (physical sciences) at [employer]: [place]  . Financial resource strain: Not on file  . Food insecurity:    Worry: Not on file    Inability: Not on file  . Transportation needs:    Medical: Not on file    Non-medical: Not on file  Tobacco Use  . Smoking status: Never Smoker  . Smokeless tobacco: Never Used  Substance and Sexual Activity  . Alcohol use: Yes    Comment: occasional/ Social  . Drug use: No  . Sexual activity: Yes  Lifestyle  . Physical activity:    Days per week: Not  on file    Minutes per session: Not on file  . Stress: Not on file  Relationships  . Social connections:    Talks on phone: Not on file    Gets together: Not on file    Attends religious service: Not on file    Active member of club or organization: Not on file    Attends meetings of clubs or organizations: Not on file    Relationship status: Not on file  Other Topics Concern  . Not on file  Social History Narrative   2 sons, they are in [place] the other in [place]-- pt                 Outpatient Encounter Medications as of 02/05/2018  Medication Sig  . atorvastatin (LIPITOR) 10 MG tablet Take 1 tablet (10 mg total) by mouth at bedtime.  . calcium-vitamin D (OSCAL WITH D) 250-125 MG-UNIT per tablet Take 1 tablet by mouth daily.    . COCONUT OIL PO Take 1 tablet by mouth daily.  . COD LIVER OIL PO Take by mouth.    Marland Kitchen GARLIC-PARSLEY PO Take by mouth.    . loratadine (KLS ALLERCLEAR) 10  MG tablet Take 1 tablet by mouth daily as needed.  . Magnesium 250 MG TABS Take 1 tablet by mouth daily.  . multivitamin (THERAGRAN) per tablet Take 1 tablet by mouth daily.    . valACYclovir (VALTREX) 500 MG tablet Take 1 tablet (500 mg total) by mouth 2 (two) times daily as needed.  . vitamin C (ASCORBIC ACID) 500 MG tablet Take 500 mg by mouth daily.    . vitamin E 400 UNIT capsule Take 400 Units by mouth daily.   No facility-administered encounter medications on file as of 02/05/2018.     Activities of Daily Living In your present state of health, do you have any difficulty performing the following activities: 02/05/2018 05/27/2017  Hearing? N N  Vision? N N  Difficulty concentrating or making decisions? N N  Walking or climbing stairs? N N  Dressing or bathing? N N  Doing errands, shopping? N N  Preparing Food and eating ? N -  Using the Toilet? N -  In the past six months, have you accidently leaked urine? N -  Do you have problems with loss of bowel control? N -  Managing your Medications? N -  Managing your Finances? N -  Housekeeping or managing your Housekeeping? N -  Some recent data might be hidden    Patient Care Team: Colon Branch, MD as PCP - General Carlean Purl Ofilia Neas, MD as Consulting Physician (Gastroenterology)    Assessment:   This is a routine wellness examination for Alyssa Murray. Physical assessment deferred to PCP.  Exercise Activities and Dietary recommendations Current Exercise Habits: Structured exercise class, Type of exercise: stretching;treadmill, Time (Minutes): 60, Frequency (Times/Week): 3, Weekly Exercise (Minutes/Week): 180, Exercise limited by: None identified Diet (meal preparation, eat out, water intake, caffeinated beverages, dairy products, fruits and vegetables): in general, a "healthy" diet  , well balanced  Goals    . Maintain current healthy lifestyle       Fall Risk Fall Risk  02/05/2018 05/27/2017 05/22/2016 05/22/2015 05/11/2013  Falls in  the past year? No No No No No    Depression Screen PHQ 2/9 Scores 02/05/2018 05/27/2017 05/22/2016 05/22/2015  PHQ - 2 Score 0 0 0 0     Cognitive Function Ad8 score reviewed for issues:  Issues making decisions:no  Less interest in hobbies / activities:no  Repeats questions, stories (family complaining):no  Trouble using ordinary gadgets (microwave, computer, phone):no  Forgets the month or year: no  Mismanaging finances: no  Remembering appts:no Daily problems with thinking and/or memory:no Ad8 score is=0        Immunization History  Administered Date(s) Administered  . Influenza Split 04/20/2012  . Influenza, High Dose Seasonal PF 03/11/2016, 04/10/2017  . Influenza,inj,Quad PF,6+ Mos 03/08/2014, 05/22/2015  . Pneumococcal Conjugate-13 03/11/2016  . Pneumococcal Polysaccharide-23 04/10/2017  . Td 11/26/2006  . Tdap 01/20/2017  . Zoster 11/24/2011  . Zoster Recombinat (Shingrix) 07/01/2017, 09/25/2017    Screening Tests Health Maintenance  Topic Date Due  . INFLUENZA VACCINE  01/21/2018  . MAMMOGRAM  08/03/2018  . COLONOSCOPY  04/07/2021  . TETANUS/TDAP  01/21/2027  . DEXA SCAN  Completed  . Hepatitis C Screening  Completed  . PNA vac Low Risk Adult  Completed      Plan:   Keep up the great work!!  Please stop downstairs to schedule your bone density scan.  I have personally reviewed and noted the following in the patient's chart:   . Medical and social history . Use of alcohol, tobacco or illicit drugs  . Current medications and supplements . Functional ability and status . Nutritional status . Physical activity . Advanced directives . List of other physicians . Hospitalizations, surgeries, and ER visits in previous 12 months . Vitals . Screenings to include cognitive, depression, and falls . Referrals and appointments  In addition, I have reviewed and discussed with patient certain preventive protocols, quality metrics, and best practice  recommendations. A written personalized care plan for preventive services as well as general preventive health recommendations were provided to patient.     Shela Nevin, [place]  02/05/2018

## 2018-02-05 ENCOUNTER — Encounter: Payer: Self-pay | Admitting: *Deleted

## 2018-02-05 ENCOUNTER — Ambulatory Visit (INDEPENDENT_AMBULATORY_CARE_PROVIDER_SITE_OTHER): Payer: Medicare Other | Admitting: *Deleted

## 2018-02-05 VITALS — BP 126/77 | HR 89 | Ht 66.0 in | Wt 162.6 lb

## 2018-02-05 DIAGNOSIS — Z0000 Encounter for general adult medical examination without abnormal findings: Secondary | ICD-10-CM

## 2018-02-05 NOTE — Patient Instructions (Signed)
Keep up the great work!!  Please stop downstairs to schedule your bone density scan   Alyssa Murray , Thank you for taking time to come for your Medicare Wellness Visit. I appreciate your ongoing commitment to your health goals. Please review the following plan we discussed and let me know if I can assist you in the future.   These are the goals we discussed: Goals    . Maintain current healthy lifestyle       This is a list of the screening recommended for you and due dates:  Health Maintenance  Topic Date Due  . Flu Shot  01/21/2018  . Mammogram  08/03/2018  . Colon Cancer Screening  04/07/2021  . Tetanus Vaccine  01/21/2027  . DEXA scan (bone density measurement)  Completed  .  Hepatitis C: One time screening is recommended by Center for Disease Control  (CDC) for  adults born from [birth year] through 1965.   Completed  . Pneumonia vaccines  Completed    Health Maintenance for Postmenopausal Women Menopause is a normal process in which your reproductive ability comes to an end. This process happens gradually over a span of months to years, usually between the ages of 69 and 77. Menopause is complete when you have missed 12 consecutive menstrual periods. It is important to talk with your health care provider about some of the most common conditions that affect postmenopausal women, such as heart disease, cancer, and bone loss (osteoporosis). Adopting a healthy lifestyle and getting preventive care can help to promote your health and wellness. Those actions can also lower your chances of developing some of these common conditions. What should I know about menopause? During menopause, you may experience a number of symptoms, such as:  Moderate-to-severe hot flashes.  Night sweats.  Decrease in sex drive.  Mood swings.  Headaches.  Tiredness.  Irritability.  Memory problems.  Insomnia.  Choosing to treat or not to treat menopausal changes is an individual decision that you make  with your health care provider. What should I know about hormone replacement therapy and supplements? Hormone therapy products are effective for treating symptoms that are associated with menopause, such as hot flashes and night sweats. Hormone replacement carries certain risks, especially as you become older. If you are thinking about using estrogen or estrogen with progestin treatments, discuss the benefits and risks with your health care provider. What should I know about heart disease and stroke? Heart disease, heart attack, and stroke become more likely as you age. This may be due, in part, to the hormonal changes that your body experiences during menopause. These can affect how your body processes dietary fats, triglycerides, and cholesterol. Heart attack and stroke are both medical emergencies. There are many things that you can do to help prevent heart disease and stroke:  Have your blood pressure checked at least every 1-2 years. High blood pressure causes heart disease and increases the risk of stroke.  If you are 59-6 years old, ask your health care provider if you should take aspirin to prevent a heart attack or a stroke.  Do not use any tobacco products, including cigarettes, chewing tobacco, or electronic cigarettes. If you need help quitting, ask your health care provider.  It is important to eat a healthy diet and maintain a healthy weight. ? Be sure to include plenty of vegetables, fruits, low-fat dairy products, and lean protein. ? Avoid eating foods that are high in solid fats, added sugars, or salt (sodium).  Get  regular exercise. This is one of the most important things that you can do for your health. ? Try to exercise for at least 150 minutes each week. The type of exercise that you do should increase your heart rate and make you sweat. This is known as moderate-intensity exercise. ? Try to do strengthening exercises at least twice each week. Do these in addition to the  moderate-intensity exercise.  Know your numbers.Ask your health care provider to check your cholesterol and your blood glucose. Continue to have your blood tested as directed by your health care provider.  What should I know about cancer screening? There are several types of cancer. Take the following steps to reduce your risk and to catch any cancer development as early as possible. Breast Cancer  Practice breast self-awareness. ? This means understanding how your breasts normally appear and feel. ? It also means doing regular breast self-exams. Let your health care provider know about any changes, no matter how small.  If you are 62 or older, have a clinician do a breast exam (clinical breast exam or CBE) every year. Depending on your age, family history, and medical history, it may be recommended that you also have a yearly breast X-ray (mammogram).  If you have a family history of breast cancer, talk with your health care provider about genetic screening.  If you are at high risk for breast cancer, talk with your health care provider about having an MRI and a mammogram every year.  Breast cancer (BRCA) gene test is recommended for women who have family members with BRCA-related cancers. Results of the assessment will determine the need for genetic counseling and BRCA1 and for BRCA2 testing. BRCA-related cancers include these types: ? Breast. This occurs in males or females. ? Ovarian. ? Tubal. This may also be called fallopian tube cancer. ? Cancer of the abdominal or pelvic lining (peritoneal cancer). ? Prostate. ? Pancreatic.  Cervical, Uterine, and Ovarian Cancer Your health care provider may recommend that you be screened regularly for cancer of the pelvic organs. These include your ovaries, uterus, and vagina. This screening involves a pelvic exam, which includes checking for microscopic changes to the surface of your cervix (Pap test).  For women ages 21-65, health care  providers may recommend a pelvic exam and a Pap test every three years. For women ages 54-65, they may recommend the Pap test and pelvic exam, combined with testing for human papilloma virus (HPV), every five years. Some types of HPV increase your risk of cervical cancer. Testing for HPV may also be done on women of any age who have unclear Pap test results.  Other health care providers may not recommend any screening for nonpregnant women who are considered low risk for pelvic cancer and have no symptoms. Ask your health care provider if a screening pelvic exam is right for you.  If you have had past treatment for cervical cancer or a condition that could lead to cancer, you need Pap tests and screening for cancer for at least 20 years after your treatment. If Pap tests have been discontinued for you, your risk factors (such as having a new sexual partner) need to be reassessed to determine if you should start having screenings again. Some women have medical problems that increase the chance of getting cervical cancer. In these cases, your health care provider may recommend that you have screening and Pap tests more often.  If you have a family history of uterine cancer or ovarian cancer,  talk with your health care provider about genetic screening.  If you have vaginal bleeding after reaching menopause, tell your health care provider.  There are currently no reliable tests available to screen for ovarian cancer.  Lung Cancer Lung cancer screening is recommended for adults 48-26 years old who are at high risk for lung cancer because of a history of smoking. A yearly low-dose CT scan of the lungs is recommended if you:  Currently smoke.  Have a history of at least 30 pack-years of smoking and you currently smoke or have quit within the past 15 years. A pack-year is smoking an average of one pack of cigarettes per day for one year.  Yearly screening should:  Continue until it has been 15 years  since you quit.  Stop if you develop a health problem that would prevent you from having lung cancer treatment.  Colorectal Cancer  This type of cancer can be detected and can often be prevented.  Routine colorectal cancer screening usually begins at age 64 and continues through age 35.  If you have risk factors for colon cancer, your health care provider may recommend that you be screened at an earlier age.  If you have a family history of colorectal cancer, talk with your health care provider about genetic screening.  Your health care provider may also recommend using home test kits to check for hidden blood in your stool.  A small camera at the end of a tube can be used to examine your colon directly (sigmoidoscopy or colonoscopy). This is done to check for the earliest forms of colorectal cancer.  Direct examination of the colon should be repeated every 5-10 years until age 67. However, if early forms of precancerous polyps or small growths are found or if you have a family history or genetic risk for colorectal cancer, you may need to be screened more often.  Skin Cancer  Check your skin from head to toe regularly.  Monitor any moles. Be sure to tell your health care provider: ? About any new moles or changes in moles, especially if there is a change in a mole's shape or color. ? If you have a mole that is larger than the size of a pencil eraser.  If any of your family members has a history of skin cancer, especially at a young age, talk with your health care provider about genetic screening.  Always use sunscreen. Apply sunscreen liberally and repeatedly throughout the day.  Whenever you are outside, protect yourself by wearing long sleeves, pants, a wide-brimmed hat, and sunglasses.  What should I know about osteoporosis? Osteoporosis is a condition in which bone destruction happens more quickly than new bone creation. After menopause, you may be at an increased risk for  osteoporosis. To help prevent osteoporosis or the bone fractures that can happen because of osteoporosis, the following is recommended:  If you are 53-12 years old, get at least 1,000 mg of calcium and at least 600 mg of vitamin D per day.  If you are older than age 62 but younger than age 48, get at least 1,200 mg of calcium and at least 600 mg of vitamin D per day.  If you are older than age 42, get at least 1,200 mg of calcium and at least 800 mg of vitamin D per day.  Smoking and excessive alcohol intake increase the risk of osteoporosis. Eat foods that are rich in calcium and vitamin D, and do weight-bearing exercises several times each  week as directed by your health care provider. What should I know about how menopause affects my mental health? Depression may occur at any age, but it is more common as you become older. Common symptoms of depression include:  Low or sad mood.  Changes in sleep patterns.  Changes in appetite or eating patterns.  Feeling an overall lack of motivation or enjoyment of activities that you previously enjoyed.  Frequent crying spells.  Talk with your health care provider if you think that you are experiencing depression. What should I know about immunizations? It is important that you get and maintain your immunizations. These include:  Tetanus, diphtheria, and pertussis (Tdap) booster vaccine.  Influenza every year before the flu season begins.  Pneumonia vaccine.  Shingles vaccine.  Your health care provider may also recommend other immunizations. This information is not intended to replace advice given to you by your health care provider. Make sure you discuss any questions you have with your health care provider. Document Released: 08/01/2005 Document Revised: 12/28/2015 Document Reviewed: 03/13/2015 Elsevier Interactive Patient Education  2018 [employer].

## 2018-02-05 NOTE — Progress Notes (Signed)
Noted. Agree with above.  [place], DO 02/05/18 4:45 PM

## 2018-02-18 ENCOUNTER — Ambulatory Visit (HOSPITAL_BASED_OUTPATIENT_CLINIC_OR_DEPARTMENT_OTHER)
Admission: RE | Admit: 2018-02-18 | Discharge: 2018-02-18 | Disposition: A | Discharge status: Home / Self Care | Payer: Medicare Other | Source: Ambulatory Visit | Attending: Internal Medicine | Admitting: Internal Medicine

## 2018-02-18 DIAGNOSIS — Z780 Asymptomatic menopausal state: Secondary | ICD-10-CM | POA: Insufficient documentation

## 2018-02-18 DIAGNOSIS — M85852 Other specified disorders of bone density and structure, left thigh: Secondary | ICD-10-CM | POA: Diagnosis not present

## 2018-03-08 DIAGNOSIS — I456 Pre-excitation syndrome: Secondary | ICD-10-CM | POA: Diagnosis not present

## 2018-03-08 DIAGNOSIS — E7800 Pure hypercholesterolemia, unspecified: Secondary | ICD-10-CM | POA: Diagnosis not present

## 2018-03-08 DIAGNOSIS — I340 Nonrheumatic mitral (valve) insufficiency: Secondary | ICD-10-CM | POA: Diagnosis not present

## 2018-03-11 DIAGNOSIS — I349 Nonrheumatic mitral valve disorder, unspecified: Secondary | ICD-10-CM | POA: Diagnosis not present

## 2018-03-11 DIAGNOSIS — I10 Essential (primary) hypertension: Secondary | ICD-10-CM | POA: Diagnosis not present

## 2018-03-11 DIAGNOSIS — D649 Anemia, unspecified: Secondary | ICD-10-CM | POA: Diagnosis not present

## 2018-03-11 DIAGNOSIS — E119 Type 2 diabetes mellitus without complications: Secondary | ICD-10-CM | POA: Diagnosis not present

## 2018-03-11 DIAGNOSIS — R079 Chest pain, unspecified: Secondary | ICD-10-CM | POA: Diagnosis not present

## 2018-03-11 DIAGNOSIS — I456 Pre-excitation syndrome: Secondary | ICD-10-CM | POA: Diagnosis not present

## 2018-03-11 DIAGNOSIS — E7800 Pure hypercholesterolemia, unspecified: Secondary | ICD-10-CM | POA: Diagnosis not present

## 2018-03-11 DIAGNOSIS — R7982 Elevated C-reactive protein (CRP): Secondary | ICD-10-CM | POA: Diagnosis not present

## 2018-03-11 DIAGNOSIS — R002 Palpitations: Secondary | ICD-10-CM | POA: Diagnosis not present

## 2018-05-31 ENCOUNTER — Ambulatory Visit (INDEPENDENT_AMBULATORY_CARE_PROVIDER_SITE_OTHER): Payer: Medicare Other | Admitting: Internal Medicine

## 2018-05-31 ENCOUNTER — Encounter: Payer: Self-pay | Admitting: Internal Medicine

## 2018-05-31 VITALS — BP 124/68 | HR 91 | Temp 98.3°F | Resp 16 | Ht 66.0 in | Wt 165.3 lb

## 2018-05-31 DIAGNOSIS — M8580 Other specified disorders of bone density and structure, unspecified site: Secondary | ICD-10-CM | POA: Diagnosis not present

## 2018-05-31 DIAGNOSIS — R7989 Other specified abnormal findings of blood chemistry: Secondary | ICD-10-CM

## 2018-05-31 DIAGNOSIS — R945 Abnormal results of liver function studies: Secondary | ICD-10-CM

## 2018-05-31 DIAGNOSIS — R7303 Prediabetes: Secondary | ICD-10-CM | POA: Diagnosis not present

## 2018-05-31 DIAGNOSIS — E785 Hyperlipidemia, unspecified: Secondary | ICD-10-CM

## 2018-05-31 DIAGNOSIS — Z23 Encounter for immunization: Secondary | ICD-10-CM

## 2018-05-31 DIAGNOSIS — D649 Anemia, unspecified: Secondary | ICD-10-CM

## 2018-05-31 DIAGNOSIS — R739 Hyperglycemia, unspecified: Secondary | ICD-10-CM

## 2018-05-31 LAB — CBC WITH DIFFERENTIAL/PLATELET
Basophils Absolute: 0 10*3/uL (ref 0.0–0.1)
Basophils Relative: 0.9 % (ref 0.0–3.0)
Eosinophils Absolute: 0.3 10*3/uL (ref 0.0–0.7)
Eosinophils Relative: 5 % (ref 0.0–5.0)
HCT: 41.7 % (ref 36.0–46.0)
Hemoglobin: 13 g/dL (ref 12.0–15.0)
Lymphocytes Relative: 32.9 % (ref 12.0–46.0)
Lymphs Abs: 1.8 10*3/uL (ref 0.7–4.0)
MCHC: 31.3 g/dL (ref 30.0–36.0)
MCV: 80.6 fl (ref 78.0–100.0)
Monocytes Absolute: 0.7 10*3/uL (ref 0.1–1.0)
Monocytes Relative: 13.2 % — ABNORMAL HIGH (ref 3.0–12.0)
Neutro Abs: 2.6 10*3/uL (ref 1.4–7.7)
Neutrophils Relative %: 48 % (ref 43.0–77.0)
Platelets: 214 10*3/uL (ref 150.0–400.0)
RBC: 5.17 Mil/uL — ABNORMAL HIGH (ref 3.87–5.11)
RDW: 14.8 % (ref 11.5–15.5)
WBC: 5.4 10*3/uL (ref 4.0–10.5)

## 2018-05-31 LAB — LIPID PANEL
Cholesterol: 148 mg/dL (ref 0–200)
HDL: 59.8 mg/dL (ref >39.00)
LDL Cholesterol: 78 mg/dL (ref 0–99)
NonHDL: 87.8
Total CHOL/HDL Ratio: 2
Triglycerides: 51 mg/dL (ref 0.0–149.0)
VLDL: 10.2 mg/dL (ref 0.0–40.0)

## 2018-05-31 LAB — COMPREHENSIVE METABOLIC PANEL
ALT: 51 U/L — ABNORMAL HIGH (ref 0–35)
AST: 43 U/L — ABNORMAL HIGH (ref 0–37)
Albumin: 3.9 g/dL (ref 3.5–5.2)
Alkaline Phosphatase: 45 U/L (ref 39–117)
BUN: 10 mg/dL (ref 6–23)
CO2: 27 mEq/L (ref 19–32)
Calcium: 10 mg/dL (ref 8.4–10.5)
Chloride: 107 mEq/L (ref 96–112)
Creatinine, Ser: 0.8 mg/dL (ref 0.40–1.20)
GFR: 91.87 mL/min (ref >60.00)
Glucose, Bld: 96 mg/dL (ref 70–99)
Potassium: 4.6 mEq/L (ref 3.5–5.1)
Sodium: 140 mEq/L (ref 135–145)
Total Bilirubin: 0.5 mg/dL (ref 0.2–1.2)
Total Protein: 7.2 g/dL (ref 6.0–8.3)

## 2018-05-31 LAB — HEMOGLOBIN A1C: Hgb A1c MFr Bld: 5.9 % (ref 4.6–6.5)

## 2018-05-31 LAB — IRON: Iron: 100 ug/dL (ref 42–145)

## 2018-05-31 LAB — FERRITIN: Ferritin: 143.2 ng/mL (ref 10.0–291.0)

## 2018-05-31 NOTE — Assessment & Plan Note (Signed)
Td 2018; zostavax 11-2011; prevnar 9-17, pnm 23 2018, s/p shingrex ; flu shot  -female care:  [facility] 08/03/17 Multiple PAPs before, never had an abnormal one, last PAP-HPV (-) 06-06-2015.  Mother recently deceased from apparently endometrial cancer.   Saw gyn 06/2017, had a PAP/HPV was rx to RTC 1 year . 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

## 2018-05-31 NOTE — Patient Instructions (Addendum)
GO TO THE LAB : Get the blood work     GO TO THE FRONT DESK Schedule your next appointment for a checkup in 1 year   Stop iron    Check the  blood pressure  Every 2-3 months Be sure your blood pressure is between 110/65 and  135/85. If it is consistently higher or lower, let me know

## 2018-05-31 NOTE — Progress Notes (Signed)
Pre visit review using our clinic review tool, if applicable. No additional management support is needed unless otherwise documented below in the visit note. 

## 2018-05-31 NOTE — Progress Notes (Signed)
Subjective:    Patient ID: Alyssa Murray, female    DOB: September 18, 1950, 67 y.o.   MRN: [id number]  DOS:  05/31/2018 Type of visit - description : rov Note from gynecology reviewed Osteopenia: Last bone density test reviewed with the patient. High cholesterol: Good compliance with medication History of anemia, still taking iron.   Review of Systems  Denies chest pain no difficulty breathing No nausea, vomiting, diarrhea. Still has occasional cough, dry, episodic.  Past Medical History:  Diagnosis Date  . Allergic rhinitis   . HSV infection    L buttocks  . HYPERGLYCEMIA, BORDERLINE 01/23/2010  . Menopause 2005  . Osteopenia   . Palpitation    cardiac ablation secondary to palptiation done aprox 2000- now Asx    Past Surgical History:  Procedure Laterality Date  . bilateral bunionectomy  2002  . CARDIAC ELECTROPHYSIOLOGY MAPPING AND ABLATION  around 2000      . FOOT SURGERY Left 03/2013   bunion/hammer toe  . MOUTH SURGERY  03-2012   implants    Social History   Socioeconomic History  . Marital status: Divorced    Spouse name: Not on file  . Number of children: 2  . Years of education: Not on file  . Highest education level: Not on file  Occupational History  . 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 Occupation: RETIRED  Research scientist (physical sciences) at [employer]: [employer]  . Financial resource strain: Not on file  . Food insecurity:    Worry: Not on file    Inability: Not on file  . Transportation needs:    Medical: Not on file    Non-medical: Not on file  Tobacco Use  . Smoking status: Never Smoker  . Smokeless tobacco: Never Used  Substance and Sexual Activity  . Alcohol use: Yes    Comment: occasional/ Social  . Drug use: No  . Sexual activity: Yes  Lifestyle  . Physical activity:    Days per week: Not on file    Minutes per session: Not on file  . Stress: Not on file  Relationships  . Social connections:    Talks on  phone: Not on file    Gets together: Not on file    Attends religious service: Not on file    Active member of club or organization: Not on file    Attends meetings of clubs or organizations: Not on file    Relationship status: Not on file  . Intimate partner violence:    Fear of current or ex partner: Not on file    Emotionally abused: Not on file    Physically abused: Not on file    Forced sexual activity: Not on file  Other Topics Concern  . Not on file  Social History Narrative   2 sons, they are in [place] the other in [place]-- pt                   Allergies as of 05/31/2018   No Known Allergies     Medication List        Accurate as of 05/31/18 11:59 PM. Always use your most recent med list.          atorvastatin 10 MG tablet Commonly known as:  LIPITOR Take 1 tablet (10 mg total) by mouth at bedtime.   calcium-vitamin D 250-125 MG-UNIT tablet Commonly known as:  OSCAL WITH  D Take 1 tablet by mouth daily.   COD LIVER OIL PO Take by mouth.   GARLIC-PARSLEY PO Take by mouth.   KLS ALLERCLEAR 10 MG tablet Generic drug:  loratadine Take 1 tablet by mouth daily as needed.   Magnesium 250 MG Tabs Take 1 tablet by mouth daily.   multivitamin per tablet Take 1 tablet by mouth daily.   Turmeric Curcumin 10-998 MG Caps Take 1,000 mg by mouth daily.   valACYclovir 500 MG tablet Commonly known as:  VALTREX Take 1 tablet (500 mg total) by mouth 2 (two) times daily as needed.   vitamin C 500 MG tablet Commonly known as:  ASCORBIC ACID Take 500 mg by mouth daily.   vitamin E 400 UNIT capsule Take 400 Units by mouth daily.           Objective:   Physical Exam BP 124/68 (BP Location: Left Arm, Patient Position: Sitting, Cuff Size: Small)   Pulse 91   Temp 98.3 F (36.8 C) (Oral)   Resp 16   Ht 5\' 6"  (1.676 m)   Wt 165 lb 5 oz (75 kg)   SpO2 96%   BMI 26.68 kg/m  General: Well developed, NAD, BMI noted Neck: No  thyromegaly    HEENT:  Normocephalic . Face symmetric, atraumatic Lungs:  CTA B Normal respiratory effort, no intercostal retractions, no accessory muscle use. Heart: RRR,  no murmur.  No pretibial edema bilaterally  Abdomen:  Not distended, soft, non-tender. No rebound or rigidity.   Skin: Exposed areas without rash. Not pale. Not jaundice Neurologic:  alert & oriented X3.  Speech normal, gait appropriate for age and unassisted Strength symmetric and appropriate for age.  Psych: Cognition and judgment appear intact.  Cooperative with normal attention span and concentration.  Behavior appropriate. No anxious or depressed appearing.     Assessment & Plan:     Assessment Prediabetes Hyperlipidemia: rx lipitor 04-2016, good response, d/c 5/2-18 d/t elevated LFTs Cardiac ablation due to palpitations ~ 2000: now asx Mild anemia 2015, normal iron Osteopenia -- T score -1.5  (2014), T score -1.7 (01/2018), nl vit D 2017 Rash (prn  lotrisone, groin) HSV infection, left buttock  PLAN: Prediabetes: Diet and exercise discussed, check a A1c Hyperlipidemia: Continue with Lipitor, check FLP, CMP. Mild anemia: Recommend to stop iron, will check a CBC, iron, ferritin, restart if needed. Osteopenia: T score -1.7 few months ago, on calcium, vitamin D, declined medication. HSV, on Valtrex as needed. Preventive care reviewed RTC 1 year

## 2018-06-01 NOTE — Assessment & Plan Note (Signed)
Prediabetes: Diet and exercise discussed, check a A1c Hyperlipidemia: Continue with Lipitor, check FLP, CMP. Mild anemia: Recommend to stop iron, will check a CBC, iron, ferritin, restart if needed. Osteopenia: T score -1.7 few months ago, on calcium, vitamin D, declined medication. HSV, on Valtrex as needed. Preventive care reviewed RTC 1 year

## 2018-06-02 NOTE — Addendum Note (Signed)
Addended byDamita Dunnings D on: 06/02/2018 04:45 PM   Modules accepted: Orders

## 2018-07-09 ENCOUNTER — Other Ambulatory Visit: Payer: Self-pay | Admitting: Internal Medicine

## 2018-07-09 DIAGNOSIS — Z1231 Encounter for screening mammogram for malignant neoplasm of breast: Secondary | ICD-10-CM

## 2018-08-04 ENCOUNTER — Ambulatory Visit
Admission: RE | Admit: 2018-08-04 | Discharge: 2018-08-04 | Disposition: A | Discharge status: Home / Self Care | Payer: Medicare Other | Source: Ambulatory Visit

## 2018-08-04 DIAGNOSIS — Z1231 Encounter for screening mammogram for malignant neoplasm of breast: Secondary | ICD-10-CM | POA: Diagnosis not present

## 2018-10-08 ENCOUNTER — Other Ambulatory Visit: Payer: Self-pay | Admitting: Internal Medicine

## 2018-10-08 MED FILL — ATORVASTATIN 10 MG TABLET: 10 | 30 days supply | Qty: 30 | Fill #0

## 2018-10-20 DIAGNOSIS — E7800 Pure hypercholesterolemia, unspecified: Secondary | ICD-10-CM | POA: Diagnosis not present

## 2018-10-20 DIAGNOSIS — I340 Nonrheumatic mitral (valve) insufficiency: Secondary | ICD-10-CM | POA: Diagnosis not present

## 2018-12-15 ENCOUNTER — Encounter: Payer: Self-pay | Admitting: Internal Medicine

## 2018-12-16 DIAGNOSIS — I340 Nonrheumatic mitral (valve) insufficiency: Secondary | ICD-10-CM | POA: Diagnosis not present

## 2018-12-16 DIAGNOSIS — I456 Pre-excitation syndrome: Secondary | ICD-10-CM | POA: Diagnosis not present

## 2018-12-16 DIAGNOSIS — E7800 Pure hypercholesterolemia, unspecified: Secondary | ICD-10-CM | POA: Diagnosis not present

## 2018-12-20 ENCOUNTER — Ambulatory Visit (INDEPENDENT_AMBULATORY_CARE_PROVIDER_SITE_OTHER): Payer: Medicare Other | Admitting: Internal Medicine

## 2018-12-20 ENCOUNTER — Other Ambulatory Visit: Payer: Self-pay

## 2018-12-20 DIAGNOSIS — R05 Cough: Secondary | ICD-10-CM | POA: Diagnosis not present

## 2018-12-20 DIAGNOSIS — R053 Chronic cough: Secondary | ICD-10-CM

## 2018-12-20 DIAGNOSIS — M25551 Pain in right hip: Secondary | ICD-10-CM | POA: Diagnosis not present

## 2018-12-20 MED ORDER — AZITHROMYCIN 250 MG PO TABS: ORAL_TABLET | ORAL | 0 refills | Status: DC
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
## 2018-12-28 DIAGNOSIS — M25551 Pain in right hip: Secondary | ICD-10-CM | POA: Diagnosis not present

## 2018-12-28 DIAGNOSIS — M545 Low back pain: Secondary | ICD-10-CM | POA: Diagnosis not present

## 2018-12-28 DIAGNOSIS — R52 Pain, unspecified: Secondary | ICD-10-CM | POA: Diagnosis not present

## 2018-12-29 ENCOUNTER — Telehealth: Payer: Self-pay

## 2018-12-29 NOTE — Telephone Encounter (Signed)
There are lab orders in place. Please schedule lab appt at her convenience.

## 2018-12-29 NOTE — Telephone Encounter (Signed)
Copied from [place] [id number]. Topic: General - Inquiry >> Dec 29, 2018 12:26 PM Virl Axe D wrote: Reason for CRM:Pt stated Dr. Larose Kells wanted her to have labs done but she didn't know what kind. No orders present. Please advise.

## 2018-12-29 NOTE — Telephone Encounter (Signed)
Lab appt scheduled 12/31/2018.

## 2018-12-31 ENCOUNTER — Other Ambulatory Visit (INDEPENDENT_AMBULATORY_CARE_PROVIDER_SITE_OTHER): Payer: Medicare Other

## 2018-12-31 ENCOUNTER — Other Ambulatory Visit: Payer: Self-pay | Admitting: Internal Medicine

## 2018-12-31 ENCOUNTER — Other Ambulatory Visit: Payer: Self-pay

## 2018-12-31 ENCOUNTER — Ambulatory Visit (HOSPITAL_BASED_OUTPATIENT_CLINIC_OR_DEPARTMENT_OTHER)
Admission: RE | Admit: 2018-12-31 | Discharge: 2018-12-31 | Disposition: A | Discharge status: Home / Self Care | Payer: Medicare Other | Source: Ambulatory Visit | Attending: Internal Medicine | Admitting: Internal Medicine

## 2018-12-31 DIAGNOSIS — R05 Cough: Secondary | ICD-10-CM | POA: Insufficient documentation

## 2018-12-31 DIAGNOSIS — R945 Abnormal results of liver function studies: Secondary | ICD-10-CM | POA: Diagnosis not present

## 2018-12-31 DIAGNOSIS — R7989 Other specified abnormal findings of blood chemistry: Secondary | ICD-10-CM

## 2018-12-31 LAB — ALT: ALT: 164 U/L — ABNORMAL HIGH (ref 0–35)

## 2018-12-31 LAB — AST: AST: 112 U/L — ABNORMAL HIGH (ref 0–37)

## 2019-01-04 LAB — HEPATITIS B SURFACE ANTIGEN: Hepatitis B Surface Ag: NONREACTIVE

## 2019-01-04 LAB — HEPATITIS B CORE ANTIBODY, TOTAL: Hep B Core Total Ab: NONREACTIVE

## 2019-01-05 DIAGNOSIS — M5416 Radiculopathy, lumbar region: Secondary | ICD-10-CM | POA: Diagnosis not present

## 2019-01-15 DIAGNOSIS — E7800 Pure hypercholesterolemia, unspecified: Secondary | ICD-10-CM | POA: Diagnosis not present

## 2019-01-15 DIAGNOSIS — I340 Nonrheumatic mitral (valve) insufficiency: Secondary | ICD-10-CM | POA: Diagnosis not present

## 2019-01-15 DIAGNOSIS — I456 Pre-excitation syndrome: Secondary | ICD-10-CM | POA: Diagnosis not present

## 2019-01-26 DIAGNOSIS — M5416 Radiculopathy, lumbar region: Secondary | ICD-10-CM | POA: Diagnosis not present

## 2019-02-01 DIAGNOSIS — M545 Low back pain: Secondary | ICD-10-CM | POA: Diagnosis not present

## 2019-02-01 DIAGNOSIS — M5416 Radiculopathy, lumbar region: Secondary | ICD-10-CM | POA: Diagnosis not present

## 2019-02-16 NOTE — Progress Notes (Signed)
Virtual Visit via Video Note  I connected with patient on 02/17/19 at 10:15 AM EDT by audio enabled telemedicine application and verified that I am speaking with the correct person using two identifiers.   THIS ENCOUNTER IS A VIRTUAL VISIT DUE TO COVID-19 - PATIENT WAS NOT SEEN IN THE OFFICE. PATIENT HAS CONSENTED TO VIRTUAL VISIT / TELEMEDICINE VISIT   Location of patient: home  Location of provider: office  I discussed the limitations of evaluation and management by telemedicine and the availability of in person appointments. The patient expressed understanding and agreed to proceed.   Subjective:   Alyssa Murray is a 68 y.o. female who presents for Medicare Annual (Subsequent) preventive examination.  Review of Systems:  Cardiac Risk Factors include: advanced age (>53men, >84 women);dyslipidemia  Home Safety/Smoke Alarms: Feels safe in home. Smoke alarms in place.  Lives alone in 2 story town home.   Female:   Pap-  07/08/17   Mammo- 08/04/18      Dexa scan- 02/18/18      CCS- due 2022 Eye doctor every 2 yrs. UTD per pt.    Objective:     Vitals: Unable to assess. This visit is enabled though telemedicine due to Covid 19.   Advanced Directives 02/17/2019 02/05/2018  Does Patient Have a Medical Advance Directive? Yes No  Type of Paramedic of [place];Living will -  Does patient want to make changes to medical advance directive? No - Patient declined -  Copy of [place] in Chart? No - copy requested -  Would patient like information on creating a medical advance directive? - Yes (MAU/Ambulatory/Procedural Areas - Information given)    Tobacco Social History   Tobacco Use  Smoking Status Never Smoker  Smokeless Tobacco Never Used     Counseling given: Not Answered   Clinical Intake:  Pain : No/denies pain     Past Medical History:  Diagnosis Date  . Allergic rhinitis   . HSV infection    L buttocks  .  HYPERGLYCEMIA, BORDERLINE 01/23/2010  . Menopause 2005  . Osteopenia   . Palpitation    cardiac ablation secondary to palptiation done aprox 2000- now Asx   Past Surgical History:  Procedure Laterality Date  . bilateral bunionectomy  2002  . CARDIAC ELECTROPHYSIOLOGY MAPPING AND ABLATION  around 2000      . FOOT SURGERY Left 03/2013   bunion/hammer toe  . MOUTH SURGERY  03-2012   implants   Family History  Problem Relation Age of Onset  . Hypertension Mother   . Lung cancer Mother   . Endometrial cancer Mother        question off  . Diabetes Other        G parents   . Throat cancer Brother   . Cancer Brother   . Coronary artery disease Neg Hx   . Stroke Neg Hx   . Colon cancer Neg Hx   . Breast cancer Neg Hx   . Ovarian cancer Neg Hx   . Stomach cancer Neg Hx    Social History   Socioeconomic History  . Marital status: Divorced    Spouse name: Not on file  . Number of children: 2  . Years of education: Not on file  . Highest education level: Not on file  Occupational History  . 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 Occupation: RETIRED  Research scientist (physical sciences) at [employer]: [employer]  Social Needs  . Financial resource strain: Not on file  . Food insecurity    Worry: Not on file    Inability: Not on file  . Transportation needs    Medical: Not on file    Non-medical: Not on file  Tobacco Use  . Smoking status: Never Smoker  . Smokeless tobacco: Never Used  Substance and Sexual Activity  . Alcohol use: Yes    Comment: occasional/ Social  . Drug use: No  . Sexual activity: Yes  Lifestyle  . Physical activity    Days per week: Not on file    Minutes per session: Not on file  . Stress: Not on file  Relationships  . Social Herbalist on phone: Not on file    Gets together: Not on file    Attends religious service: Not on file    Active member of club or organization: Not on file    Attends meetings of clubs or organizations:  Not on file    Relationship status: Not on file  Other Topics Concern  . Not on file  Social History Narrative   2 sons, they are in [place] the other in [place]-- pt                 Outpatient Encounter Medications as of 02/17/2019  Medication Sig  . calcium-vitamin D (OSCAL WITH D) 250-125 MG-UNIT per tablet Take 1 tablet by mouth daily.    Marland Kitchen loratadine (KLS ALLERCLEAR) 10 MG tablet Take 1 tablet by mouth daily as needed.  . Magnesium 250 MG TABS Take 1 tablet by mouth daily.  . valACYclovir (VALTREX) 500 MG tablet Take 1 tablet (500 mg total) by mouth 2 (two) times daily as needed. (Patient not taking: Reported on 02/17/2019)  . [DISCONTINUED] azithromycin (ZITHROMAX Z-PAK) 250 MG tablet 2 tabs a day the first day, then 1 tab a day x 4 days   No facility-administered encounter medications on file as of 02/17/2019.     Activities of Daily Living In your present state of health, do you have any difficulty performing the following activities: 02/17/2019  Hearing? N  Vision? N  Difficulty concentrating or making decisions? N  Walking or climbing stairs? N  Dressing or bathing? N  Doing errands, shopping? N  Preparing Food and eating ? N  Using the Toilet? N  In the past six months, have you accidently leaked urine? N  Do you have problems with loss of bowel control? N  Managing your Medications? N  Managing your Finances? N  Housekeeping or managing your Housekeeping? N  Some recent data might be hidden    Patient Care Team: Colon Branch, MD as PCP - General Carlean Purl Ofilia Neas, MD as Consulting Physician (Gastroenterology)    Assessment:   This is a routine wellness examination for [place]. Physical assessment deferred to PCP.  Exercise Activities and Dietary recommendations Current Exercise Habits: Home exercise routine, Type of exercise: walking, Time (Minutes): 30, Frequency (Times/Week): 2, Weekly Exercise (Minutes/Week): 60, Intensity: Mild, Exercise limited by:  None identified Diet (meal preparation, eat out, water intake, caffeinated beverages, dairy products, fruits and vegetables): in general, a "healthy" diet  , well balanced   Goals    . Maintain current healthy lifestyle       Fall Risk Fall Risk  02/17/2019 02/05/2018 05/27/2017 05/22/2016 05/22/2015  Falls in the past year? 0 No No No No     Depression Screen  PHQ 2/9 Scores 02/17/2019 02/05/2018 05/27/2017 05/22/2016  PHQ - 2 Score 0 0 0 0     Cognitive Function Ad8 score reviewed for issues:  Issues making decisions:no  Less interest in hobbies / activities:no  Repeats questions, stories (family complaining):no  Trouble using ordinary gadgets (microwave, computer, phone):no  Forgets the month or year: no  Mismanaging finances: no  Remembering appts:no  Daily problems with thinking and/or memory:no Ad8 score is=0         Immunization History  Administered Date(s) Administered  . Influenza Split 04/20/2012  . Influenza, High Dose Seasonal PF 03/11/2016, 04/10/2017, 05/31/2018  . Influenza,inj,Quad PF,6+ Mos 03/08/2014, 05/22/2015  . Pneumococcal Conjugate-13 03/11/2016  . Pneumococcal Polysaccharide-23 04/10/2017  . Td 11/26/2006  . Tdap 01/20/2017  . Zoster 11/24/2011  . Zoster Recombinat (Shingrix) 07/01/2017, 09/25/2017    Screening Tests Health Maintenance  Topic Date Due  . INFLUENZA VACCINE  01/22/2019  . MAMMOGRAM  08/05/2019  . COLONOSCOPY  04/07/2021  . TETANUS/TDAP  01/21/2027  . DEXA SCAN  Completed  . Hepatitis C Screening  Completed  . PNA vac Low Risk Adult  Completed        Plan:    Please schedule your next medicare wellness visit with me in 1 yr.  Bring a copy of your living will and/or healthcare power of attorney to your next office visit.  Continue to eat heart healthy diet (full of fruits, vegetables, whole grains, lean protein, water--limit salt, fat, and sugar intake) and increase physical activity as tolerated.     I  have personally reviewed and noted the following in the patient's chart:   . Medical and social history . Use of alcohol, tobacco or illicit drugs  . Current medications and supplements . Functional ability and status . Nutritional status . Physical activity . Advanced directives . List of other physicians . Hospitalizations, surgeries, and ER visits in previous 12 months . Vitals . Screenings to include cognitive, depression, and falls . Referrals and appointments  In addition, I have reviewed and discussed with patient certain preventive protocols, quality metrics, and best practice recommendations. A written personalized care plan for preventive services as well as general preventive health recommendations were provided to patient.     Naaman Plummer [place], [place]  02/17/2019

## 2019-02-17 ENCOUNTER — Ambulatory Visit (INDEPENDENT_AMBULATORY_CARE_PROVIDER_SITE_OTHER): Payer: Medicare Other | Admitting: *Deleted

## 2019-02-17 ENCOUNTER — Other Ambulatory Visit: Payer: Self-pay

## 2019-02-17 ENCOUNTER — Encounter: Payer: Self-pay | Admitting: *Deleted

## 2019-02-17 DIAGNOSIS — Z0000 Encounter for general adult medical examination without abnormal findings: Secondary | ICD-10-CM

## 2019-02-17 NOTE — Patient Instructions (Signed)
Please schedule your next medicare wellness visit with me in 1 yr.  Bring a copy of your living will and/or healthcare power of attorney to your next office visit.  Continue to eat heart healthy diet (full of fruits, vegetables, whole grains, lean protein, water--limit salt, fat, and sugar intake) and increase physical activity as tolerated.     Alyssa Murray , Thank you for taking time to come for your Medicare Wellness Visit. I appreciate your ongoing commitment to your health goals. Please review the following plan we discussed and let me know if I can assist you in the future.   These are the goals we discussed: Goals    . Maintain current healthy lifestyle       This is a list of the screening recommended for you and due dates:  Health Maintenance  Topic Date Due  . Flu Shot  01/22/2019  . Mammogram  08/05/2019  . Colon Cancer Screening  04/07/2021  . Tetanus Vaccine  01/21/2027  . DEXA scan (bone density measurement)  Completed  .  Hepatitis C: One time screening is recommended by Center for Disease Control  (CDC) for  adults born from [birth year] through 1965.   Completed  . Pneumonia vaccines  Completed    Health Maintenance After Age 22 After age 50, you are at a higher risk for certain long-term diseases and infections as well as injuries from falls. Falls are a major cause of broken bones and head injuries in people who are older than age 26. Getting regular preventive care can help to keep you healthy and well. Preventive care includes getting regular testing and making lifestyle changes as recommended by your health care provider. Talk with your health care provider about:  Which screenings and tests you should have. A screening is a test that checks for a disease when you have no symptoms.  A diet and exercise plan that is right for you. What should I know about screenings and tests to prevent falls? Screening and testing are the best ways to find a health problem early.  Early diagnosis and treatment give you the best chance of managing medical conditions that are common after age 15. Certain conditions and lifestyle choices may make you more likely to have a fall. Your health care provider may recommend:  Regular vision checks. Poor vision and conditions such as cataracts can make you more likely to have a fall. If you wear glasses, make sure to get your prescription updated if your vision changes.  Medicine review. Work with your health care provider to regularly review all of the medicines you are taking, including over-the-counter medicines. Ask your health care provider about any side effects that may make you more likely to have a fall. Tell your health care provider if any medicines that you take make you feel dizzy or sleepy.  Osteoporosis screening. Osteoporosis is a condition that causes the bones to get weaker. This can make the bones weak and cause them to break more easily.  Blood pressure screening. Blood pressure changes and medicines to control blood pressure can make you feel dizzy.  Strength and balance checks. Your health care provider may recommend certain tests to check your strength and balance while standing, walking, or changing positions.  Foot health exam. Foot pain and numbness, as well as not wearing proper footwear, can make you more likely to have a fall.  Depression screening. You may be more likely to have a fall if you have a fear  of falling, feel emotionally low, or feel unable to do activities that you used to do.  Alcohol use screening. Using too much alcohol can affect your balance and may make you more likely to have a fall. What actions can I take to lower my risk of falls? General instructions  Talk with your health care provider about your risks for falling. Tell your health care provider if: ? You fall. Be sure to tell your health care provider about all falls, even ones that seem minor. ? You feel dizzy, sleepy, or  off-balance.  Take over-the-counter and prescription medicines only as told by your health care provider. These include any supplements.  Eat a healthy diet and maintain a healthy weight. A healthy diet includes low-fat dairy products, low-fat (lean) meats, and fiber from whole grains, beans, and lots of fruits and vegetables. Home safety  Remove any tripping hazards, such as rugs, cords, and clutter.  Install safety equipment such as grab bars in bathrooms and safety rails on stairs.  Keep rooms and walkways well-lit. Activity   Follow a regular exercise program to stay fit. This will help you maintain your balance. Ask your health care provider what types of exercise are appropriate for you.  If you need a cane or walker, use it as recommended by your health care provider.  Wear supportive shoes that have nonskid soles. Lifestyle  Do not drink alcohol if your health care provider tells you not to drink.  If you drink alcohol, limit how much you have: ? 0-1 drink a day for women. ? 0-2 drinks a day for men.  Be aware of how much alcohol is in your drink. In the [place] one drink equals one typical bottle of beer (12 oz), one-half glass of wine (5 oz), or one shot of hard liquor (1 oz).  Do not use any products that contain nicotine or tobacco, such as cigarettes and e-cigarettes. If you need help quitting, ask your health care provider. Summary  Having a healthy lifestyle and getting preventive care can help to protect your health and wellness after age 69.  Screening and testing are the best way to find a health problem early and help you avoid having a fall. Early diagnosis and treatment give you the best chance for managing medical conditions that are more common for people who are older than age 23.  Falls are a major cause of broken bones and head injuries in people who are older than age 13. Take precautions to prevent a fall at home.  Work with your health care provider  to learn what changes you can make to improve your health and wellness and to prevent falls. This information is not intended to replace advice given to you by your health care provider. Make sure you discuss any questions you have with your health care provider. Document Released: 04/22/2017 Document Revised: 09/30/2018 Document Reviewed: 04/22/2017 Elsevier Patient Education  2020 [employer].

## 2019-02-18 DIAGNOSIS — I456 Pre-excitation syndrome: Secondary | ICD-10-CM | POA: Diagnosis not present

## 2019-02-18 DIAGNOSIS — I340 Nonrheumatic mitral (valve) insufficiency: Secondary | ICD-10-CM | POA: Diagnosis not present

## 2019-02-18 DIAGNOSIS — E7800 Pure hypercholesterolemia, unspecified: Secondary | ICD-10-CM | POA: Diagnosis not present

## 2019-02-24 ENCOUNTER — Ambulatory Visit (INDEPENDENT_AMBULATORY_CARE_PROVIDER_SITE_OTHER): Payer: Medicare Other

## 2019-02-24 ENCOUNTER — Other Ambulatory Visit: Payer: Self-pay

## 2019-02-24 DIAGNOSIS — Z23 Encounter for immunization: Secondary | ICD-10-CM

## 2019-03-21 DIAGNOSIS — E7800 Pure hypercholesterolemia, unspecified: Secondary | ICD-10-CM | POA: Diagnosis not present

## 2019-03-21 DIAGNOSIS — I456 Pre-excitation syndrome: Secondary | ICD-10-CM | POA: Diagnosis not present

## 2019-03-21 DIAGNOSIS — I340 Nonrheumatic mitral (valve) insufficiency: Secondary | ICD-10-CM | POA: Diagnosis not present

## 2019-03-23 ENCOUNTER — Encounter: Payer: Self-pay | Admitting: Internal Medicine

## 2019-03-24 ENCOUNTER — Other Ambulatory Visit: Payer: Self-pay | Admitting: Internal Medicine

## 2019-03-24 MED ORDER — CLOTRIMAZOLE-BETAMETHASONE 1-0.05 % EX CREA: TOPICAL_CREAM | Freq: Two times a day (BID) | CUTANEOUS | 1 refills | Status: DC | PRN

## 2019-03-31 ENCOUNTER — Encounter: Payer: Self-pay | Admitting: Internal Medicine

## 2019-04-04 ENCOUNTER — Encounter: Payer: Self-pay | Admitting: Internal Medicine

## 2019-04-04 ENCOUNTER — Ambulatory Visit (INDEPENDENT_AMBULATORY_CARE_PROVIDER_SITE_OTHER): Payer: Medicare Other | Admitting: Internal Medicine

## 2019-04-04 DIAGNOSIS — M542 Cervicalgia: Secondary | ICD-10-CM

## 2019-04-04 DIAGNOSIS — G4452 New daily persistent headache (NDPH): Secondary | ICD-10-CM | POA: Diagnosis not present

## 2019-04-04 MED ORDER — CYCLOBENZAPRINE HCL 10 MG PO TABS: 10.0000 mg | ORAL_TABLET | Freq: Every evening | ORAL | 0 refills | Status: DC | PRN

## 2019-04-04 NOTE — Progress Notes (Signed)
Subjective:    Patient ID: Alyssa Murray, female    DOB: 02/05/1951, 68 y.o.   MRN: [id number]  DOS:  04/04/2019 Type of visit - description: Virtual Visit via Video Note  I connected with@   by a video enabled telemedicine application and verified that I am speaking with the correct person using two identifiers.   THIS ENCOUNTER IS A VIRTUAL VISIT DUE TO COVID-19 - PATIENT WAS NOT SEEN IN THE OFFICE. PATIENT HAS CONSENTED TO VIRTUAL VISIT / TELEMEDICINE VISIT   Location of patient: home  Location of provider: office  I discussed the limitations of evaluation and management by telemedicine and the availability of in person appointments. The patient expressed understanding and agreed to proceed.  History of Present Illness:   Acute, reason for call is pain. Reports pain located at the right side of the posterior neck and head. Initially was triggered only by coughing. Now the pain for the last few days is constant.  Described as sharp.  Intense at times. No worse when she moves her head or neck No radiation. Has not tried any medications. When asked, she admits is the worst headache of her life.    Review of Systems No fever chills No dizziness No face numbness or slurred speech No nausea or vomiting.   Past Medical History:  Diagnosis Date  . Allergic rhinitis   . HSV infection    L buttocks  . HYPERGLYCEMIA, BORDERLINE 01/23/2010  . Menopause 2005  . Osteopenia   . Palpitation    cardiac ablation secondary to palptiation done aprox 2000- now Asx    Past Surgical History:  Procedure Laterality Date  . bilateral bunionectomy  2002  . CARDIAC ELECTROPHYSIOLOGY MAPPING AND ABLATION  around 2000      . FOOT SURGERY Left 03/2013   bunion/hammer toe  . MOUTH SURGERY  03-2012   implants    Social History   Socioeconomic History  . Marital status: Divorced    Spouse name: Not on file  . Number of children: 2  . Years of education: Not on file  . Highest  education level: Not on file  Occupational History  . 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 Occupation: RETIRED  Research scientist (physical sciences) at [employer]: [place]  . Financial resource strain: Not on file  . Food insecurity    Worry: Not on file    Inability: Not on file  . Transportation needs    Medical: Not on file    Non-medical: Not on file  Tobacco Use  . Smoking status: Never Smoker  . Smokeless tobacco: Never Used  Substance and Sexual Activity  . Alcohol use: Yes    Comment: occasional/ Social  . Drug use: No  . Sexual activity: Yes  Lifestyle  . Physical activity    Days per week: Not on file    Minutes per session: Not on file  . Stress: Not on file  Relationships  . Social Herbalist on phone: Not on file    Gets together: Not on file    Attends religious service: Not on file    Active member of club or organization: Not on file    Attends meetings of clubs or organizations: Not on file    Relationship status: Not on file  . Intimate partner violence    Fear of current or ex partner: Not on file    Emotionally  abused: Not on file    Physically abused: Not on file    Forced sexual activity: Not on file  Other Topics Concern  . Not on file  Social History Narrative   2 sons, they are in [place] the other in [place]-- pt                   Allergies as of 04/04/2019   No Known Allergies     Medication List       Accurate as of April 04, 2019 11:59 PM. If you have any questions, ask your nurse or doctor.        calcium-vitamin D 250-125 MG-UNIT tablet Commonly known as: OSCAL WITH D Take 1 tablet by mouth daily.   clotrimazole-betamethasone cream Commonly known as: LOTRISONE Apply topically 2 (two) times daily as needed.   cyclobenzaprine 10 MG tablet Commonly known as: FLEXERIL Take 1 tablet (10 mg total) by mouth at bedtime as needed for muscle spasms. Started by: Kathlene November, MD    KLS AllerClear 10 MG tablet Generic drug: loratadine Take 1 tablet by mouth daily as needed.   Magnesium 250 MG Tabs Take 1 tablet by mouth daily.   valACYclovir 500 MG tablet Commonly known as: VALTREX Take 1 tablet (500 mg total) by mouth 2 (two) times daily as needed.           Objective:   Physical Exam HENT:     Head:     There were no vitals taken for this visit. This is a virtual video visit, she is alert oriented x3, face symmetric, head motion seems normal    Assessment      Assessment Prediabetes Hyperlipidemia: rx lipitor 04-2016, good response, d/c 5/2-18 d/t elevated LFTs Cardiac ablation due to palpitations ~ 2000: now asx Mild anemia 2015, normal iron Osteopenia -- T score -1.5  (2014), T score -1.7 (01/2018), nl vit D 2017 Rash (prn  lotrisone, groin) HSV infection, left buttock  PLAN: Headache/neck pain: As described above, persistent for several days, "worst of life". Given location is hard to say if this is primarily a headache or neck pain but it is described as "worst of life" thus will get a CT head now. If negative will proceed with MSK type of treatment with: Tylenol, ibuprofen, warm compresses, Flexeril.  Detailed instructions sent through my chart. Patient verbalized understanding.   I discussed the assessment and treatment plan with the patient. The patient was provided an opportunity to ask questions and all were answered. The patient agreed with the plan and demonstrated an understanding of the instructions.   The patient was advised to call back or seek an in-person evaluation if the symptoms worsen or if the condition fails to improve as anticipated.

## 2019-04-05 NOTE — Assessment & Plan Note (Signed)
Headache/neck pain: As described above, persistent for several days, "worst of life". Given location is hard to say if this is primarily a headache or neck pain but it is described as "worst of life" thus will get a CT head now. If negative will proceed with MSK type of treatment with: Tylenol, ibuprofen, warm compresses, Flexeril.  Detailed instructions sent through my chart. Patient verbalized understanding.

## 2019-04-06 ENCOUNTER — Ambulatory Visit (HOSPITAL_BASED_OUTPATIENT_CLINIC_OR_DEPARTMENT_OTHER)
Admission: RE | Admit: 2019-04-06 | Discharge: 2019-04-06 | Disposition: A | Discharge status: Home / Self Care | Payer: Medicare Other | Source: Ambulatory Visit | Attending: Internal Medicine | Admitting: Internal Medicine

## 2019-04-06 ENCOUNTER — Other Ambulatory Visit: Payer: Self-pay

## 2019-04-06 DIAGNOSIS — G4452 New daily persistent headache (NDPH): Secondary | ICD-10-CM | POA: Diagnosis not present

## 2019-04-06 DIAGNOSIS — R519 Headache, unspecified: Secondary | ICD-10-CM | POA: Diagnosis not present

## 2019-04-12 ENCOUNTER — Encounter: Payer: Self-pay | Admitting: Internal Medicine

## 2019-04-13 ENCOUNTER — Telehealth: Payer: Self-pay

## 2019-04-13 NOTE — Telephone Encounter (Signed)
Copied from [id number] [phone]. Topic: General - Inquiry >> Apr 13, 2019 11:50 AM Mathis Bud wrote: Reason for CRM: Patient called stating she needs her A1C check, she got a voicemail regarding this. But no lab order in yet. Call back [phone]

## 2019-04-13 NOTE — Telephone Encounter (Signed)
Alyssa Murray- this is the patient that received the [facility] call- she isn't diabetic.

## 2019-04-14 DIAGNOSIS — I340 Nonrheumatic mitral (valve) insufficiency: Secondary | ICD-10-CM | POA: Diagnosis not present

## 2019-04-14 DIAGNOSIS — I456 Pre-excitation syndrome: Secondary | ICD-10-CM | POA: Diagnosis not present

## 2019-04-14 DIAGNOSIS — E7800 Pure hypercholesterolemia, unspecified: Secondary | ICD-10-CM | POA: Diagnosis not present

## 2019-05-05 ENCOUNTER — Ambulatory Visit: Payer: Self-pay

## 2019-05-05 ENCOUNTER — Other Ambulatory Visit: Payer: Self-pay | Admitting: Occupational Medicine

## 2019-05-05 ENCOUNTER — Other Ambulatory Visit: Payer: Self-pay

## 2019-05-05 DIAGNOSIS — Z0000 Encounter for general adult medical examination without abnormal findings: Secondary | ICD-10-CM

## 2019-05-16 DIAGNOSIS — E7800 Pure hypercholesterolemia, unspecified: Secondary | ICD-10-CM | POA: Diagnosis not present

## 2019-05-16 DIAGNOSIS — I340 Nonrheumatic mitral (valve) insufficiency: Secondary | ICD-10-CM | POA: Diagnosis not present

## 2019-06-06 ENCOUNTER — Other Ambulatory Visit: Payer: Self-pay

## 2019-06-07 ENCOUNTER — Other Ambulatory Visit: Payer: Self-pay

## 2019-06-07 ENCOUNTER — Encounter: Payer: Self-pay | Admitting: Internal Medicine

## 2019-06-07 ENCOUNTER — Encounter: Payer: Medicare Other | Admitting: Internal Medicine

## 2019-06-07 ENCOUNTER — Ambulatory Visit (INDEPENDENT_AMBULATORY_CARE_PROVIDER_SITE_OTHER): Payer: Medicare Other | Admitting: Internal Medicine

## 2019-06-07 VITALS — BP 108/68 | HR 94 | Temp 96.5°F | Ht 67.0 in | Wt 160.0 lb

## 2019-06-07 DIAGNOSIS — E785 Hyperlipidemia, unspecified: Secondary | ICD-10-CM

## 2019-06-07 DIAGNOSIS — R739 Hyperglycemia, unspecified: Secondary | ICD-10-CM

## 2019-06-07 DIAGNOSIS — R7989 Other specified abnormal findings of blood chemistry: Secondary | ICD-10-CM | POA: Diagnosis not present

## 2019-06-07 DIAGNOSIS — D649 Anemia, unspecified: Secondary | ICD-10-CM

## 2019-06-07 DIAGNOSIS — M8580 Other specified disorders of bone density and structure, unspecified site: Secondary | ICD-10-CM

## 2019-06-07 LAB — LIPID PANEL
Cholesterol: 190 mg/dL (ref 0–200)
HDL: 50.8 mg/dL (ref >39.00)
LDL Cholesterol: 125 mg/dL — ABNORMAL HIGH (ref 0–99)
NonHDL: 139.59
Total CHOL/HDL Ratio: 4
Triglycerides: 75 mg/dL (ref 0.0–149.0)
VLDL: 15 mg/dL (ref 0.0–40.0)

## 2019-06-07 LAB — CBC
HCT: 41.7 % (ref 36.0–46.0)
Hemoglobin: 13 g/dL (ref 12.0–15.0)
MCHC: 31.2 g/dL (ref 30.0–36.0)
MCV: 77.9 fl — ABNORMAL LOW (ref 78.0–100.0)
Platelets: 302 10*3/uL (ref 150.0–400.0)
RBC: 5.35 Mil/uL — ABNORMAL HIGH (ref 3.87–5.11)
RDW: 15.6 % — ABNORMAL HIGH (ref 11.5–15.5)
WBC: 6 10*3/uL (ref 4.0–10.5)

## 2019-06-07 LAB — COMPREHENSIVE METABOLIC PANEL
ALT: 31 U/L (ref 0–35)
AST: 27 U/L (ref 0–37)
Albumin: 4 g/dL (ref 3.5–5.2)
Alkaline Phosphatase: 55 U/L (ref 39–117)
BUN: 8 mg/dL (ref 6–23)
CO2: 28 mEq/L (ref 19–32)
Calcium: 10.3 mg/dL (ref 8.4–10.5)
Chloride: 105 mEq/L (ref 96–112)
Creatinine, Ser: 0.87 mg/dL (ref 0.40–1.20)
GFR: 78.23 mL/min (ref >60.00)
Glucose, Bld: 96 mg/dL (ref 70–99)
Potassium: 4.5 mEq/L (ref 3.5–5.1)
Sodium: 138 mEq/L (ref 135–145)
Total Bilirubin: 0.6 mg/dL (ref 0.2–1.2)
Total Protein: 7.7 g/dL (ref 6.0–8.3)

## 2019-06-07 LAB — HEMOGLOBIN A1C: Hgb A1c MFr Bld: 6 % (ref 4.6–6.5)

## 2019-06-07 NOTE — Assessment & Plan Note (Signed)
-  Td 2018 - zostavax 11-2011 -prevnar 9-17, pnm 23 2018 - s/p shingrex - s/p flu shot  -female care:  [facility] 07/2018 Saw gyn 06/2017, had a PAP/HPV, rec to call and schedule -[facility]: Colonoscopy --06/23/2000, in [place] , reportedly normal cscope 03-2011, polyps, tics, 10 years, Dr Carlean Purl  -Diet -exercise discussed

## 2019-06-07 NOTE — Progress Notes (Signed)
Subjective:    Patient ID: Alyssa Murray, female    DOB: 01/12/1951, 68 y.o.   MRN: [id number]  DOS:  06/07/2019 Type of visit - description: Routine office visit Headache, see last visit, symptoms quickly resolved and they have not returned. DJD: Was seen several months ago with hip pain, after she saw a specialist she was diagnosed with back DJD, treating conservatively Increase LFTs: Suspected to be due to Lipitor, she denies taking excessive Tylenol, drinks very little. Increase cholesterol: Off Lipitor since 12-2018, due for labs    Review of Systems Denies chest pain or difficulty breathing No nausea, vomiting, diarrhea. No palpitations No anxiety or depression Past Medical History:  Diagnosis Date  . Allergic rhinitis   . HSV infection    L buttocks  . HYPERGLYCEMIA, BORDERLINE 01/23/2010  . Menopause 2005  . Osteopenia   . Palpitation    cardiac ablation secondary to palptiation done aprox 2000- now Asx    Past Surgical History:  Procedure Laterality Date  . bilateral bunionectomy  2002  . CARDIAC ELECTROPHYSIOLOGY MAPPING AND ABLATION  around 2000      . FOOT SURGERY Left 03/2013   bunion/hammer toe  . MOUTH SURGERY  03-2012   implants    Social History   Socioeconomic History  . Marital status: Divorced    Spouse name: Not on file  . Number of children: 2  . Years of education: Not on file  . Highest education level: Not on file  Occupational History  . 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 Occupation: RETIRED  Research scientist (physical sciences) at [employer]: [employer]  Tobacco Use  . Smoking status: Never Smoker  . Smokeless tobacco: Never Used  Substance and Sexual Activity  . Alcohol use: Yes    Comment: occasional/ Social  . Drug use: No  . Sexual activity: Yes  Other Topics Concern  . Not on file  Social History Narrative   2 sons, they are in [place] the other in [place]-- pt                Social Determinants of  Health   Financial Resource Strain:   . Difficulty of Paying Living Expenses: Not on file  Food Insecurity:   . Worried About Charity fundraiser in the Last Year: Not on file  . Ran Out of Food in the Last Year: Not on file  Transportation Needs:   . Lack of Transportation (Medical): Not on file  . Lack of Transportation (Non-Medical): Not on file  Physical Activity:   . Days of Exercise per Week: Not on file  . Minutes of Exercise per Session: Not on file  Stress:   . Feeling of Stress : Not on file  Social Connections:   . Frequency of Communication with Friends and Family: Not on file  . Frequency of Social Gatherings with Friends and Family: Not on file  . Attends Religious Services: Not on file  . Active Member of Clubs or Organizations: Not on file  . Attends Archivist Meetings: Not on file  . Marital Status: Not on file  Intimate Partner Violence:   . Fear of Current or Ex-Partner: Not on file  . Emotionally Abused: Not on file  . Physically Abused: Not on file  . Sexually Abused: Not on file      Allergies as of 06/07/2019   No Known Allergies  Medication List       Accurate as of June 07, 2019  5:52 PM. If you have any questions, ask your nurse or doctor.        STOP taking these medications   cyclobenzaprine 10 MG tablet Commonly known as: FLEXERIL Stopped by: Kathlene November, MD     TAKE these medications   calcium-vitamin D 250-125 MG-UNIT tablet Commonly known as: OSCAL WITH D Take 1 tablet by mouth daily.   clotrimazole-betamethasone cream Commonly known as: LOTRISONE Apply topically 2 (two) times daily as needed.   KLS AllerClear 10 MG tablet Generic drug: loratadine Take 1 tablet by mouth daily as needed.   Magnesium 250 MG Tabs Take 1 tablet by mouth daily.   valACYclovir 500 MG tablet Commonly known as: VALTREX Take 1 tablet (500 mg total) by mouth 2 (two) times daily as needed.           Objective:   Physical  Exam BP 108/68 (BP Location: Left Arm, Patient Position: Sitting, Cuff Size: Normal)   Pulse 94   Temp (!) 96.5 F (35.8 C) (Temporal)   Ht 5\' 7"  (1.702 m)   Wt 160 lb (72.6 kg)   SpO2 96%   BMI 25.06 kg/m  General: Well developed, NAD, BMI noted Neck: No  thyromegaly  HEENT:  Normocephalic . Face symmetric, atraumatic Lungs:  CTA B Normal respiratory effort, no intercostal retractions, no accessory muscle use. Heart: RRR,  no murmur.  No pretibial edema bilaterally  Abdomen:  Not distended, soft, non-tender. No rebound or rigidity.   Skin: Exposed areas without rash. Not pale. Not jaundice Neurologic:  alert & oriented X3.  Speech normal, gait appropriate for age and unassisted Strength symmetric and appropriate for age.  Psych: Cognition and judgment appear intact.  Cooperative with normal attention span and concentration.  Behavior appropriate. No anxious or depressed appearing.     Assessment      Assessment Prediabetes Hyperlipidemia: rx lipitor 04-2016, good response, d/c 5/2-18 d/t elevated LFTs Cardiac ablation due to palpitations ~ 2000: now asx Mild anemia 2015, normal iron Osteopenia -- T score -1.5  (2014), T score -1.7 (01/2018), nl vit D 2017 Rash (prn  lotrisone, groin) HSV infection, left buttock  PLAN: Preventive care reviewed Prediabetes: Doing well with diet, less active than last year, gyms are closed, diet remains good. Hyperlipidemia: Lipitor discontinue due to increased LFTs.  Recheck FLP today Increase LFTs: No history of excessive Tylenol intake, EtOH: Drinks very rarely, rechecking labs today History of anemia: Check a CBC Osteopenia: Last DEXA 2019, normal vitamin D, she is taking supplements and is trying to remain active. Headache: See last visit, CT negative, symptoms resolved. RTC 6 months    This visit occurred during the SARS-CoV-2 public health emergency.  Safety protocols were in place, including screening questions prior to  the visit, additional usage of staff PPE, and extensive cleaning of exam room while observing appropriate contact time as indicated for disinfecting solutions.

## 2019-06-07 NOTE — Assessment & Plan Note (Signed)
Preventive care reviewed Prediabetes: Doing well with diet, less active than last year, gyms are closed, diet remains good. Hyperlipidemia: Lipitor discontinue due to increased LFTs.  Recheck FLP today Increase LFTs: No history of excessive Tylenol intake, EtOH: Drinks very rarely, rechecking labs today History of anemia: Check a CBC Osteopenia: Last DEXA 2019, normal vitamin D, she is taking supplements and is trying to remain active. Headache: See last visit, CT negative, symptoms resolved. RTC 6 months

## 2019-06-07 NOTE — Patient Instructions (Signed)
GO TO THE LAB : Get the blood work     GO TO THE FRONT DESK Schedule your next appointment for a   checkup in 6 months 

## 2019-06-10 NOTE — Progress Notes (Signed)
Lab orders placed, I have sent patient mychart message to schedule lab appointment.

## 2019-06-10 NOTE — Addendum Note (Signed)
Addended by: Wynonia Musty A on: 06/10/2019 09:12 AM   Modules accepted: Orders

## 2019-07-15 ENCOUNTER — Other Ambulatory Visit: Payer: Medicare Other

## 2019-07-20 ENCOUNTER — Other Ambulatory Visit (INDEPENDENT_AMBULATORY_CARE_PROVIDER_SITE_OTHER): Payer: Medicare Other

## 2019-07-20 ENCOUNTER — Other Ambulatory Visit: Payer: Self-pay

## 2019-07-20 DIAGNOSIS — E785 Hyperlipidemia, unspecified: Secondary | ICD-10-CM

## 2019-07-20 DIAGNOSIS — R7989 Other specified abnormal findings of blood chemistry: Secondary | ICD-10-CM

## 2019-07-20 LAB — AST: AST: 23 U/L (ref 0–37)

## 2019-07-20 LAB — LIPID PANEL
Cholesterol: 150 mg/dL (ref 0–200)
HDL: 47.3 mg/dL (ref >39.00)
LDL Cholesterol: 89 mg/dL (ref 0–99)
NonHDL: 102.65
Total CHOL/HDL Ratio: 3
Triglycerides: 68 mg/dL (ref 0.0–149.0)
VLDL: 13.6 mg/dL (ref 0.0–40.0)

## 2019-07-20 LAB — ALT: ALT: 24 U/L (ref 0–35)

## 2019-08-09 ENCOUNTER — Other Ambulatory Visit: Payer: Self-pay | Admitting: Internal Medicine

## 2019-08-09 DIAGNOSIS — Z1231 Encounter for screening mammogram for malignant neoplasm of breast: Secondary | ICD-10-CM

## 2019-08-10 ENCOUNTER — Ambulatory Visit
Admission: RE | Admit: 2019-08-10 | Discharge: 2019-08-10 | Disposition: A | Discharge status: Home / Self Care | Payer: Medicare Other | Source: Ambulatory Visit

## 2019-08-10 ENCOUNTER — Other Ambulatory Visit: Payer: Self-pay

## 2019-08-10 ENCOUNTER — Encounter: Payer: Self-pay | Admitting: Internal Medicine

## 2019-08-10 DIAGNOSIS — Z1231 Encounter for screening mammogram for malignant neoplasm of breast: Secondary | ICD-10-CM

## 2019-08-11 ENCOUNTER — Other Ambulatory Visit: Payer: Self-pay | Admitting: Internal Medicine

## 2019-08-11 DIAGNOSIS — R928 Other abnormal and inconclusive findings on diagnostic imaging of breast: Secondary | ICD-10-CM

## 2019-08-15 ENCOUNTER — Ambulatory Visit (INDEPENDENT_AMBULATORY_CARE_PROVIDER_SITE_OTHER): Payer: Medicare Other | Admitting: Obstetrics & Gynecology

## 2019-08-15 ENCOUNTER — Encounter: Payer: Self-pay | Admitting: Obstetrics & Gynecology

## 2019-08-15 ENCOUNTER — Other Ambulatory Visit: Payer: Self-pay

## 2019-08-15 ENCOUNTER — Other Ambulatory Visit (HOSPITAL_COMMUNITY)
Admission: RE | Admit: 2019-08-15 | Discharge: 2019-08-15 | Disposition: A | Discharge status: Home / Self Care | Payer: Medicare Other | Source: Ambulatory Visit | Attending: Obstetrics & Gynecology | Admitting: Obstetrics & Gynecology

## 2019-08-15 VITALS — BP 125/65 | HR 86 | Ht 67.0 in | Wt 159.0 lb

## 2019-08-15 DIAGNOSIS — Z01419 Encounter for gynecological examination (general) (routine) without abnormal findings: Secondary | ICD-10-CM | POA: Insufficient documentation

## 2019-08-15 DIAGNOSIS — Z1151 Encounter for screening for human papillomavirus (HPV): Secondary | ICD-10-CM | POA: Diagnosis not present

## 2019-08-15 DIAGNOSIS — A630 Anogenital (venereal) warts: Secondary | ICD-10-CM | POA: Diagnosis not present

## 2019-08-15 DIAGNOSIS — Z780 Asymptomatic menopausal state: Secondary | ICD-10-CM | POA: Diagnosis not present

## 2019-08-15 NOTE — Progress Notes (Signed)
Subjective:     Alyssa Murray is a 69 y.o. female here for a routine exam.   [id number]. LMP 17years prev. Current complaints: No gyn.  No bleeding since that time. Pt has vasomotor sx that are maneable. No leakage of urine. Pt is sexually active   Pt is monogamous. Genital herpes- >12. On episodic meds only Pt reports an outbreak rarely last one was >4 years prev. Pt is not currently sexually active due to [place] and distance from partner. She does have a Genital warts that she noted 6 months prev.        Pts mother had a caner of the female organs- not sure what the primary was. She was 81 year.         Gynecologic History No LMP recorded. Patient is postmenopausal. Contraception: post menopausal status Last Pap: 2016. Results were: normal Last mammogram: 08/10/2019 Results were: incomplete   Obstetric History OB History  Gravida Para Term Preterm AB Living  3 2 2   1 2   SAB TAB Ectopic Multiple Live Births  1       2    # Outcome Date GA Lbr Len/2nd Weight Sex Delivery Anes PTL Lv  3 Term 12/02/88    M Vag-Spont   LIV  2 Term 02/15/85 [redacted]w[redacted]d   M Vag-Spont   LIV  1 SAB 06/23/72            The following portions of the patient's history were reviewed and updated as appropriate: allergies, current medications, past family history, past medical history, past social history, past surgical history and problem list.  Review of Systems Pertinent items are noted in HPI.    Objective:  BP 125/65   Pulse 86   Ht 5\' 7"  (1.702 m)   Wt 159 lb (72.1 kg)   BMI 24.90 kg/m   General Appearance:    Alert, cooperative, no distress, appears stated age  Head:    Normocephalic, without obvious abnormality, atraumatic  Eyes:    conjunctiva/corneas clear, EOM's intact, both eyes  Ears:    Normal external ear canals, both ears  Nose:   Nares normal, septum midline, mucosa normal, no drainage    or sinus tenderness  Throat:   Lips, mucosa, and tongue normal; teeth and gums normal  Neck:   Supple,  symmetrical, trachea midline, no adenopathy;    thyroid:  no enlargement/tenderness/nodules  Back:     Symmetric, no curvature, ROM normal, no CVA tenderness  Lungs:     respirations unlabored  Chest Wall:    No tenderness or deformity   Heart:    Regular rate and rhythm  Breast Exam:    No tenderness, masses, or nipple abnormality  Abdomen:     Soft, non-tender, bowel sounds active all four quadrants,    no masses, no organomegaly  Genitalia:    Normal female without lesion, discharge or tenderness   Uterus: small and mobile.  The ext genitalia has a condyloma on the labia majus on the right side on the lower egde     Extremities:   Extremities normal, atraumatic, no cyanosis or edema  Pulses:   2+ and symmetric all extremities  Skin:   Skin color, texture, turgor normal, no rashes or lesions   Patient identified, informed consent signed and copy in chart, time out performed.    Areas of typical appearing genital warts noted on the right labia majus.  TCA applied until warts had white appearance.  Baking soda  poultice applied.  Patient tolerated the procedure well.    Post procedure instructions given and patient told to wash area in thirty minutes.   Assessment:    Healthy female exam.   Genital warts. S/p tx with TCA Mammogram incomplete- needs additional studies. (this was noted after pt left)     Plan:  Return in 2 weeks for next treatment F/u in 1 year for annual  F/u PAP and hrHPV Additional images on mammogram ordered for 08/25/2019   British Moyd L. Harraway-Smith, M.D., Cherlynn June

## 2019-08-15 NOTE — Addendum Note (Signed)
Addended by: Phill Myron on: 08/15/2019 11:57 AM   Modules accepted: Orders

## 2019-08-15 NOTE — Progress Notes (Signed)
Patient had mammogram on 08-10-19 and is scheduled for diagnostic in March. Kathrene Alu RN

## 2019-08-15 NOTE — Patient Instructions (Signed)
Genital Warts Genital warts are a common STD (sexually transmitted disease). They may appear as small bumps on the skin of the genital and anal areas. They sometimes become irritated and cause pain. Genital warts are easily passed to other people through sexual contact. Many people do not know that they are infected. They may be infected for years without symptoms. However, even if they do not have symptoms, they can pass the infection to their sexual partners. Getting treatment is important because genital warts can lead to other problems. In females, the virus that causes genital warts may increase the risk for cervical cancer. What are the causes? This condition is caused by a virus that is called human papillomavirus (HPV). HPV is spread by having unprotected sex with an infected person. It can be spread through vaginal, anal, and oral sex. What increases the risk? You are more likely to develop this condition if:  You have unprotected sex.  You have multiple sexual partners.  You are sexually active before age 68.  You are a man who isnot circumcised.  You have a female sexual partner who is not circumcised.  You have a weakened body defense system (immune system) due to disease or medicine.  You smoke. What are the signs or symptoms? Symptoms of this condition include:  Small growths in the genital area or anal area. These warts often grow in clusters.  Itching and irritation in the genital area or anal area.  Bleeding from the warts.  Pain during sex. How is this diagnosed? This condition is diagnosed based on:  Your symptoms.  A physical exam. You may also have other tests, including:  Biopsy. A tissue sample is removed so it can be checked under a microscope.  Colposcopy. In females, a magnifying tool is used to examine the vagina and cervix. Certain solutions may be used to make the HPV cells change color so they can be seen more easily.  A Pap test in  females.  Tests for other STDs. How is this treated? This condition may be treated with:  Medicines, such as: ? Solutions or creams applied to your skin (topical).  Procedures, such as: ? Freezing the warts with liquid nitrogen (cryotherapy). ? Burning the warts with a laser or electric probe (electrocautery). ? Surgery to remove the warts. Follow these instructions at home: Medicines   Apply over-the-counter and prescription medicines only as told by your health care provider.  Do not treat genital warts with medicines that are used for treating hand warts.  Talk with your health care provider about using over-the-counter anti-itch creams. Instructions for women  Get screened regularly for cervical cancer. Women who have genital warts are at an increased risk for this cancer. This type of cancer is slow growing and can be cured if it is found early.  If you become pregnant, tell your health care provider that you have had an HPV infection. Your health care provider will monitor you closely during pregnancy to be sure that your baby is safe. General instructions  Do not touch or scratch the warts.  Do not have sex until your treatment has been completed.  Tell your current and past sexual partners about your condition because they may also need treatment.  After treatment, use condoms during sex to prevent future infections.  Keep all follow-up visits as told by your health care provider. This is important. How is this prevented? Talk with your health care provider about getting the HPV vaccine. The vaccine:  Can prevent some HPV infections and cancers.  Is recommended for males and females who are 11-26 years old.  Is not recommended for pregnant women.  Will not work if you already have HPV. Contact a health care provider if you:  Have redness, swelling, or pain in the area of the treated skin.  Have a fever.  Feel generally ill.  Feel lumps in and around  your genital or anal area.  Have bleeding in your genital or anal area.  Have pain during sex. Summary  Genital warts are a common STD (sexually transmitted disease). It may appear as small bumps on the genital and anal areas.  This condition is caused by a virus that is called human papillomavirus (HPV). HPV is spread by having unprotected sex with an infected person. It can be spread through vaginal, anal, and oral sex.  Treatment is important because genital warts can lead to other problems. In females, the virus that causes genital warts may increase the risk for cervical cancer.  This condition may be treated with medicine that is applied to the skin, or procedures to remove the warts.  The HPV vaccine can prevent some HPV infections and cancers. It is recommended that the vaccine be given to males and females who are 11-26 years old. This information is not intended to replace advice given to you by your health care provider. Make sure you discuss any questions you have with your health care provider. Document Revised: 07/14/2017 Document Reviewed: 07/14/2017 Elsevier Patient Education  2020 Elsevier Inc[employer]  

## 2019-08-16 LAB — CYTOLOGY - PAP
Comment: NEGATIVE
Diagnosis: NEGATIVE
High risk HPV: NEGATIVE

## 2019-08-25 ENCOUNTER — Ambulatory Visit: Payer: Medicare Other

## 2019-08-25 ENCOUNTER — Ambulatory Visit
Admission: RE | Admit: 2019-08-25 | Discharge: 2019-08-25 | Disposition: A | Discharge status: Home / Self Care | Payer: Medicare Other | Source: Ambulatory Visit | Attending: Internal Medicine | Admitting: Internal Medicine

## 2019-08-25 ENCOUNTER — Other Ambulatory Visit: Payer: Self-pay

## 2019-08-25 DIAGNOSIS — R928 Other abnormal and inconclusive findings on diagnostic imaging of breast: Secondary | ICD-10-CM

## 2019-09-02 ENCOUNTER — Encounter: Payer: Self-pay | Admitting: Internal Medicine

## 2019-09-09 ENCOUNTER — Ambulatory Visit (INDEPENDENT_AMBULATORY_CARE_PROVIDER_SITE_OTHER): Payer: Medicare Other | Admitting: Obstetrics & Gynecology

## 2019-09-09 ENCOUNTER — Other Ambulatory Visit: Payer: Self-pay

## 2019-09-09 ENCOUNTER — Encounter: Payer: Self-pay | Admitting: Obstetrics & Gynecology

## 2019-09-09 VITALS — BP 125/71 | HR 83 | Ht 67.0 in | Wt 158.0 lb

## 2019-09-09 DIAGNOSIS — A630 Anogenital (venereal) warts: Secondary | ICD-10-CM | POA: Diagnosis not present

## 2019-09-09 NOTE — Progress Notes (Signed)
Patient identified, informed consent signed and copy in chart, time out performed.    Areas of typical appearing genital warts noted on the right labia majus. TCA applied until warts had white appearance.  Baking soda poultice applied.  Patient tolerated the procedure well.    Post procedure instructions given and patient told to wash area in thirty minutes.  Return in 2-4 weeks for next treatment.  Amere Bricco L. Harraway-Smith, M.D., Cherlynn June

## 2019-09-09 NOTE — Patient Instructions (Signed)
Genital Warts Genital warts are a common STD (sexually transmitted disease). They may appear as small bumps on the skin of the genital and anal areas. They sometimes become irritated and cause pain. Genital warts are easily passed to other people through sexual contact. Many people do not know that they are infected. They may be infected for years without symptoms. However, even if they do not have symptoms, they can pass the infection to their sexual partners. Getting treatment is important because genital warts can lead to other problems. In females, the virus that causes genital warts may increase the risk for cervical cancer. What are the causes? This condition is caused by a virus that is called human papillomavirus (HPV). HPV is spread by having unprotected sex with an infected person. It can be spread through vaginal, anal, and oral sex. What increases the risk? You are more likely to develop this condition if:  You have unprotected sex.  You have multiple sexual partners.  You are sexually active before age 68.  You are a man who isnot circumcised.  You have a female sexual partner who is not circumcised.  You have a weakened body defense system (immune system) due to disease or medicine.  You smoke. What are the signs or symptoms? Symptoms of this condition include:  Small growths in the genital area or anal area. These warts often grow in clusters.  Itching and irritation in the genital area or anal area.  Bleeding from the warts.  Pain during sex. How is this diagnosed? This condition is diagnosed based on:  Your symptoms.  A physical exam. You may also have other tests, including:  Biopsy. A tissue sample is removed so it can be checked under a microscope.  Colposcopy. In females, a magnifying tool is used to examine the vagina and cervix. Certain solutions may be used to make the HPV cells change color so they can be seen more easily.  A Pap test in  females.  Tests for other STDs. How is this treated? This condition may be treated with:  Medicines, such as: ? Solutions or creams applied to your skin (topical).  Procedures, such as: ? Freezing the warts with liquid nitrogen (cryotherapy). ? Burning the warts with a laser or electric probe (electrocautery). ? Surgery to remove the warts. Follow these instructions at home: Medicines   Apply over-the-counter and prescription medicines only as told by your health care provider.  Do not treat genital warts with medicines that are used for treating hand warts.  Talk with your health care provider about using over-the-counter anti-itch creams. Instructions for women  Get screened regularly for cervical cancer. Women who have genital warts are at an increased risk for this cancer. This type of cancer is slow growing and can be cured if it is found early.  If you become pregnant, tell your health care provider that you have had an HPV infection. Your health care provider will monitor you closely during pregnancy to be sure that your baby is safe. General instructions  Do not touch or scratch the warts.  Do not have sex until your treatment has been completed.  Tell your current and past sexual partners about your condition because they may also need treatment.  After treatment, use condoms during sex to prevent future infections.  Keep all follow-up visits as told by your health care provider. This is important. How is this prevented? Talk with your health care provider about getting the HPV vaccine. The vaccine:  Can prevent some HPV infections and cancers.  Is recommended for males and females who are 11-26 years old.  Is not recommended for pregnant women.  Will not work if you already have HPV. Contact a health care provider if you:  Have redness, swelling, or pain in the area of the treated skin.  Have a fever.  Feel generally ill.  Feel lumps in and around  your genital or anal area.  Have bleeding in your genital or anal area.  Have pain during sex. Summary  Genital warts are a common STD (sexually transmitted disease). It may appear as small bumps on the genital and anal areas.  This condition is caused by a virus that is called human papillomavirus (HPV). HPV is spread by having unprotected sex with an infected person. It can be spread through vaginal, anal, and oral sex.  Treatment is important because genital warts can lead to other problems. In females, the virus that causes genital warts may increase the risk for cervical cancer.  This condition may be treated with medicine that is applied to the skin, or procedures to remove the warts.  The HPV vaccine can prevent some HPV infections and cancers. It is recommended that the vaccine be given to males and females who are 11-26 years old. This information is not intended to replace advice given to you by your health care provider. Make sure you discuss any questions you have with your health care provider. Document Revised: 07/14/2017 Document Reviewed: 07/14/2017 Elsevier Patient Education  2020 Elsevier Inc[employer]  

## 2019-09-13 ENCOUNTER — Other Ambulatory Visit: Payer: Self-pay | Admitting: Internal Medicine

## 2019-09-14 ENCOUNTER — Other Ambulatory Visit: Payer: Self-pay | Admitting: Internal Medicine

## 2019-09-22 DIAGNOSIS — R05 Cough: Secondary | ICD-10-CM | POA: Diagnosis not present

## 2019-09-22 DIAGNOSIS — K219 Gastro-esophageal reflux disease without esophagitis: Secondary | ICD-10-CM | POA: Diagnosis not present

## 2019-10-03 DIAGNOSIS — R05 Cough: Secondary | ICD-10-CM | POA: Diagnosis not present

## 2019-10-05 ENCOUNTER — Encounter: Payer: Self-pay | Admitting: Obstetrics & Gynecology

## 2019-10-05 ENCOUNTER — Other Ambulatory Visit: Payer: Self-pay

## 2019-10-05 ENCOUNTER — Ambulatory Visit (INDEPENDENT_AMBULATORY_CARE_PROVIDER_SITE_OTHER): Payer: Medicare Other | Admitting: Obstetrics & Gynecology

## 2019-10-05 VITALS — BP 112/57 | HR 78 | Ht 67.0 in | Wt 158.0 lb

## 2019-10-05 DIAGNOSIS — A630 Anogenital (venereal) warts: Secondary | ICD-10-CM | POA: Diagnosis not present

## 2019-10-05 NOTE — Progress Notes (Signed)
  History:  69 y.o. [id number] here today for reeval of genital warts. She thinks that the lesions are completely resolved.     The following portions of the patient's history were reviewed and updated as appropriate: allergies, current medications, past family history, past medical history, past social history, past surgical history and problem list.  Review of Systems:  Pertinent items are noted in HPI.    Objective:  Physical Exam Blood pressure (!) 112/57, pulse 78, height 5\' 7"  (1.702 m), weight 158 lb (71.7 kg).  CONSTITUTIONAL: Well-developed, well-nourished female in no acute distress.  HENT:  Normocephalic, atraumatic EYES: Conjunctivae and EOM are normal. No scleral icterus.  NECK: Normal range of motion SKIN: Skin is warm and dry. No rash noted. Not diaphoretic.No pallor. [place]: Alert and oriented to person, place, and time. Normal coordination.  Pelvic: Normal appearing external genitalia; no lesions noted.   Assessment & Plan:  Genital warts- resolved.   F/u Feb 2022 or sooner prn  Alyssa Murray L. Harraway-Smith, M.D., Cherlynn June

## 2019-10-05 NOTE — Progress Notes (Signed)
Patient presents for follow up of treatment of warts. Kathrene Alu RN

## 2019-10-10 DIAGNOSIS — R05 Cough: Secondary | ICD-10-CM | POA: Diagnosis not present

## 2019-11-08 DIAGNOSIS — I456 Pre-excitation syndrome: Secondary | ICD-10-CM | POA: Diagnosis not present

## 2019-11-08 DIAGNOSIS — I340 Nonrheumatic mitral (valve) insufficiency: Secondary | ICD-10-CM | POA: Diagnosis not present

## 2019-11-08 DIAGNOSIS — E7800 Pure hypercholesterolemia, unspecified: Secondary | ICD-10-CM | POA: Diagnosis not present

## 2019-11-23 ENCOUNTER — Encounter: Payer: Self-pay | Admitting: Internal Medicine

## 2019-11-23 ENCOUNTER — Ambulatory Visit (INDEPENDENT_AMBULATORY_CARE_PROVIDER_SITE_OTHER): Payer: Medicare Other | Admitting: Internal Medicine

## 2019-11-23 ENCOUNTER — Other Ambulatory Visit: Payer: Self-pay

## 2019-11-23 VITALS — BP 134/69 | HR 87 | Temp 96.2°F | Resp 16 | Ht 67.0 in | Wt 161.4 lb

## 2019-11-23 DIAGNOSIS — B009 Herpesviral infection, unspecified: Secondary | ICD-10-CM | POA: Diagnosis not present

## 2019-11-23 DIAGNOSIS — R05 Cough: Secondary | ICD-10-CM | POA: Diagnosis not present

## 2019-11-23 DIAGNOSIS — E785 Hyperlipidemia, unspecified: Secondary | ICD-10-CM | POA: Diagnosis not present

## 2019-11-23 DIAGNOSIS — R059 Cough, unspecified: Secondary | ICD-10-CM

## 2019-11-23 NOTE — Patient Instructions (Addendum)
° °  GO TO THE FRONT DESK, PLEASE SCHEDULE YOUR APPOINTMENTS Come back for  a physical in 6 months  

## 2019-11-23 NOTE — Progress Notes (Signed)
Subjective:    Patient ID: Alyssa Murray, female    DOB: 1951-05-18, 69 y.o.   MRN: [id number]  DOS:  11/23/2019 Type of visit - description: Routine office visit Today we talk about her cholesterol, HSV, cough. She was seen by a pulmonologist, cough felt to be due to GERD, treatment provided, she is doing great.    Review of Systems Diet is very good, plans to start a exercise program soon  Past Medical History:  Diagnosis Date  . Allergic rhinitis   . HSV infection    L buttocks  . HYPERGLYCEMIA, BORDERLINE 01/23/2010  . Menopause 2005  . Osteopenia   . Palpitation    cardiac ablation secondary to palptiation done aprox 2000- now Asx    Past Surgical History:  Procedure Laterality Date  . bilateral bunionectomy  2002  . CARDIAC ELECTROPHYSIOLOGY MAPPING AND ABLATION  around 2000      . FOOT SURGERY Left 03/2013   bunion/hammer toe  . MOUTH SURGERY  03-2012   implants    Allergies as of 11/23/2019   No Known Allergies     Medication List       Accurate as of November 23, 2019  9:21 AM. If you have any questions, ask your nurse or doctor.        STOP taking these medications   calcium carbonate 1250 (500 Ca) MG tablet Commonly known as: OS-CAL - dosed in mg of elemental calcium Stopped by: Kathlene November, MD     TAKE these medications   CALCIUM 600+D HIGH POTENCY PO   D3-1000 25 MCG (1000 UT) tablet Generic drug: Cholecalciferol   KLS AllerClear 10 MG tablet Generic drug: loratadine Take 1 tablet by mouth daily as needed.   Magnesium 250 MG Tabs Take 1 tablet by mouth daily.   One-A-Day Womens 50+ Advantage Tabs   pantoprazole 40 MG tablet Commonly known as: PROTONIX Take 1 tablet by mouth 2 (two) times daily before a meal.   Turmeric Curcumin 500 MG Caps   valACYclovir 500 MG tablet Commonly known as: VALTREX Take 1 tablet (500 mg total) by mouth 2 (two) times daily as needed.          Objective:   Physical Exam BP 134/69 (BP Location: Left Arm,  Patient Position: Sitting, Cuff Size: Small)   Pulse 87   Temp (!) 96.2 F (35.7 C) (Temporal)   Resp 16   Ht 5\' 7"  (1.702 m)   Wt 161 lb 6 oz (73.2 kg)   SpO2 100%   BMI 25.27 kg/m   General:   Well developed, NAD, BMI noted. HEENT:  Normocephalic . Face symmetric, atraumatic Lungs:  CTA B Normal respiratory effort, no intercostal retractions, no accessory muscle use. Heart: RRR,  no murmur.  Lower extremities: no pretibial edema bilaterally  Skin: Not pale. Not jaundice Neurologic:  alert & oriented X3.  Speech normal, gait appropriate for age and unassisted Psych--  Cognition and judgment appear intact.  Cooperative with normal attention span and concentration.  Behavior appropriate. No anxious or depressed appearing.      Assessment     Assessment Prediabetes Hyperlipidemia: rx lipitor 04-2016, good response, d/c 5/2-18 d/t elevated LFTs Cardiac ablation due to palpitations ~ 2000: now asx Mild anemia 2015, normal iron Osteopenia -- T score -1.5  (2014), T score -1.7 (01/2018), nl vit D 2017 Rash (prn  lotrisone, groin) HSV infection, left buttock  PLAN: Hyperlipidemia: had a  FLP December 2020, LDL was elevated,  declined need medicines, change her diet, follow-up FLP January 2021 was very good.  Encouraged to continue with her healthy lifestyle. HSV: Well-controlled on medications Cough: Saw pulmonology due to cough, DX was GERD, on Protonix twice daily, symptoms resolved, plans to follow-up with them soon Preventive care: Had Covid shots RTC 6 months CPX    This visit occurred during the SARS-CoV-2 public health emergency.  Safety protocols were in place, including screening questions prior to the visit, additional usage of staff PPE, and extensive cleaning of exam room while observing appropriate contact time as indicated for disinfecting solutions.

## 2019-11-23 NOTE — Assessment & Plan Note (Signed)
Hyperlipidemia: had a  FLP December 2020, LDL was elevated, declined need medicines, change her diet, follow-up FLP January 2021 was very good.  Encouraged to continue with her healthy lifestyle. HSV: Well-controlled on medications Cough: Saw pulmonology due to cough, DX was GERD, on Protonix twice daily, symptoms resolved, plans to follow-up with them soon Preventive care: Had Covid shots RTC 6 months CPX

## 2019-11-23 NOTE — Progress Notes (Signed)
Pre visit review using our clinic review tool, if applicable. No additional management support is needed unless otherwise documented below in the visit note. 

## 2020-01-09 DIAGNOSIS — I340 Nonrheumatic mitral (valve) insufficiency: Secondary | ICD-10-CM | POA: Diagnosis not present

## 2020-01-09 DIAGNOSIS — E7800 Pure hypercholesterolemia, unspecified: Secondary | ICD-10-CM | POA: Diagnosis not present

## 2020-01-09 DIAGNOSIS — I456 Pre-excitation syndrome: Secondary | ICD-10-CM | POA: Diagnosis not present

## 2020-01-12 ENCOUNTER — Ambulatory Visit (INDEPENDENT_AMBULATORY_CARE_PROVIDER_SITE_OTHER): Payer: Medicare Other | Admitting: Internal Medicine

## 2020-01-12 ENCOUNTER — Encounter: Payer: Self-pay | Admitting: Internal Medicine

## 2020-01-12 ENCOUNTER — Other Ambulatory Visit: Payer: Self-pay

## 2020-01-12 VITALS — BP 127/75 | HR 84 | Temp 98.3°F | Resp 16 | Ht 67.0 in | Wt 158.0 lb

## 2020-01-12 DIAGNOSIS — H9312 Tinnitus, left ear: Secondary | ICD-10-CM

## 2020-01-12 DIAGNOSIS — J302 Other seasonal allergic rhinitis: Secondary | ICD-10-CM | POA: Diagnosis not present

## 2020-01-12 NOTE — Progress Notes (Signed)
Pre visit review using our clinic review tool, if applicable. No additional management support is needed unless otherwise documented below in the visit note. 

## 2020-01-12 NOTE — Patient Instructions (Signed)
Continue loratadine  Get Flonase over-the-counter: 2 sprays on each side of the nose daily until better  Okay to take Tylenol or Aleve as needed for headache  Rest and take lots of fluids  Call if not gradually better  Call if severe headache, dizziness, slurred speech, double vision,   or any other alarming symptoms

## 2020-01-12 NOTE — Progress Notes (Signed)
Subjective:    Patient ID: Alyssa Murray, female    DOB: 30-Aug-1950, 69 y.o.   MRN: [id number]  DOS:  01/12/2020 Type of visit - description: Acute Patient is concerned because of symptoms for the last week: Left chronic tinnitus is slightly worse. Developed dizziness x2 after she stood up, lasted few seconds, no associated nausea or vomiting. Has also developed very mild discomfort at the back of the head.  "Not really a headache"   Review of Systems No fever chills Admits to sinus congestion/pressure. Both ears feel full. No chest pain, leg edema or palpitations.  Cough is currently well controlled. No diplopia, slurred speech or motor deficits  Past Medical History:  Diagnosis Date  . Allergic rhinitis   . HSV infection    L buttocks  . HYPERGLYCEMIA, BORDERLINE 01/23/2010  . Menopause 2005  . Osteopenia   . Palpitation    cardiac ablation secondary to palptiation done aprox 2000- now Asx    Past Surgical History:  Procedure Laterality Date  . bilateral bunionectomy  2002  . CARDIAC ELECTROPHYSIOLOGY MAPPING AND ABLATION  around 2000      . FOOT SURGERY Left 03/2013   bunion/hammer toe  . MOUTH SURGERY  03-2012   implants    Allergies as of 01/12/2020      Reactions   Dust Mite Extract Cough, Other (See Comments)   Tree Extract Cough, Other (See Comments)      Medication List       Accurate as of January 12, 2020 11:59 PM. If you have any questions, ask your nurse or doctor.        CALCIUM 600+D HIGH POTENCY PO   D3-1000 25 MCG (1000 UT) tablet Generic drug: Cholecalciferol   KLS AllerClear 10 MG tablet Generic drug: loratadine Take 1 tablet by mouth daily as needed.   Magnesium 250 MG Tabs Take 1 tablet by mouth daily.   One-A-Day Womens 50+ Advantage Tabs   pantoprazole 40 MG tablet Commonly known as: PROTONIX Take 1 tablet by mouth 2 (two) times daily before a meal.   Turmeric Curcumin 500 MG Caps   valACYclovir 500 MG tablet Commonly known  as: VALTREX Take 1 tablet (500 mg total) by mouth 2 (two) times daily as needed.          Objective:   Physical Exam BP 127/75 (BP Location: Left Arm, Patient Position: Sitting, Cuff Size: Small)   Pulse 84   Temp 98.3 F (36.8 C) (Oral)   Resp 16   Ht 5\' 7"  (1.702 m)   Wt 158 lb (71.7 kg)   SpO2 99%   BMI 24.75 kg/m  General:   Well developed, NAD, BMI noted. HEENT:  Normocephalic . Face symmetric, atraumatic TMs: Both are slightly bulged, not red. Nose minimally congested, throat symmetric, no red Lungs:  CTA B Normal respiratory effort, no intercostal retractions, no accessory muscle use. Heart: RRR,  no murmur.  Lower extremities: no pretibial edema bilaterally  Skin: Not pale. Not jaundice Neurologic:  alert & oriented X3.  Speech normal, gait appropriate for age and unassisted. EOMI, motor symmetric.  Neck range of motion normal and supple Psych--  Cognition and judgment appear intact.  Cooperative with normal attention span and concentration.  Behavior appropriate. No anxious or depressed appearing.      Assessment    Assessment Prediabetes Hyperlipidemia: rx lipitor 04-2016, good response, d/c 5/2-18 d/t elevated LFTs Cardiac ablation due to palpitations ~ 2000: now asx Mild anemia 2015,  normal iron Osteopenia -- T score -1.5  (2014), T score -1.7 (01/2018), nl vit D 2017 Rash (prn  lotrisone, groin) HSV infection, left buttock  PLAN: Allergies: Suspect the increase in the chronic tinnitus, nasal congestion, mild headache and dizziness related to allergies. Continue loratadine.  Add Flonase.  Okay to take Aleve as needed for headaches. See AVS.  Call if not better. Chronic left tinnitus: This is a chronic issue, slightly worse lately, had a hearing test 07-2019 she was told it was okay.  This visit occurred during the SARS-CoV-2 public health emergency.  Safety protocols were in place, including screening questions prior to the visit, additional usage  of staff PPE, and extensive cleaning of exam room while observing appropriate contact time as indicated for disinfecting solutions.

## 2020-01-13 DIAGNOSIS — H9319 Tinnitus, unspecified ear: Secondary | ICD-10-CM | POA: Insufficient documentation

## 2020-01-13 NOTE — Assessment & Plan Note (Signed)
Allergies: Suspect the increase in the chronic tinnitus, nasal congestion, mild headache and dizziness related to allergies. Continue loratadine.  Add Flonase.  Okay to take Aleve as needed for headaches. See AVS.  Call if not better. Chronic left tinnitus: This is a chronic issue, slightly worse lately, had a hearing test 07-2019 she was told it was okay.

## 2020-02-08 DIAGNOSIS — K219 Gastro-esophageal reflux disease without esophagitis: Secondary | ICD-10-CM | POA: Diagnosis not present

## 2020-02-08 DIAGNOSIS — Z79899 Other long term (current) drug therapy: Secondary | ICD-10-CM | POA: Diagnosis not present

## 2020-02-08 DIAGNOSIS — R05 Cough: Secondary | ICD-10-CM | POA: Diagnosis not present

## 2020-02-09 DIAGNOSIS — I456 Pre-excitation syndrome: Secondary | ICD-10-CM | POA: Diagnosis not present

## 2020-02-09 DIAGNOSIS — I340 Nonrheumatic mitral (valve) insufficiency: Secondary | ICD-10-CM | POA: Diagnosis not present

## 2020-02-09 DIAGNOSIS — E7800 Pure hypercholesterolemia, unspecified: Secondary | ICD-10-CM | POA: Diagnosis not present

## 2020-02-22 ENCOUNTER — Ambulatory Visit (INDEPENDENT_AMBULATORY_CARE_PROVIDER_SITE_OTHER): Payer: Medicare Other | Admitting: *Deleted

## 2020-02-22 ENCOUNTER — Other Ambulatory Visit: Payer: Self-pay

## 2020-02-22 ENCOUNTER — Encounter: Payer: Self-pay | Admitting: *Deleted

## 2020-02-22 DIAGNOSIS — Z0000 Encounter for general adult medical examination without abnormal findings: Secondary | ICD-10-CM

## 2020-02-22 NOTE — Patient Instructions (Signed)
Continue to eat heart healthy diet (full of fruits, vegetables, whole grains, lean protein, water--limit salt, fat, and sugar intake) and increase physical activity as tolerated.  Continue doing brain stimulating activities (puzzles, reading, adult coloring books, staying active) to keep memory sharp.    Alyssa Murray , Thank you for taking time to come for your Medicare Wellness Visit. I appreciate your ongoing commitment to your health goals. Please review the following plan we discussed and let me know if I can assist you in the future.   These are the goals we discussed: Goals    . Maintain current healthy lifestyle       This is a list of the screening recommended for you and due dates:  Health Maintenance  Topic Date Due  . Flu Shot  01/22/2020  . Mammogram  08/09/2020  . Colon Cancer Screening  04/07/2021  . Tetanus Vaccine  01/21/2027  . DEXA scan (bone density measurement)  Completed  . COVID-19 Vaccine  Completed  .  Hepatitis C: One time screening is recommended by Center for Disease Control  (CDC) for  adults born from [birth year] through 1965.   Completed  . Pneumonia vaccines  Completed    Preventive Care 53 Years and Older, Female Preventive care refers to lifestyle choices and visits with your health care provider that can promote health and wellness. This includes:  A yearly physical exam. This is also called an annual well check.  Regular dental and eye exams.  Immunizations.  Screening for certain conditions.  Healthy lifestyle choices, such as diet and exercise. What can I expect for my preventive care visit? Physical exam Your health care provider will check:  Height and weight. These may be used to calculate body mass index (BMI), which is a measurement that tells if you are at a healthy weight.  Heart rate and blood pressure.  Your skin for abnormal spots. Counseling Your health care provider may ask you questions about:  Alcohol, tobacco, and drug  use.  Emotional well-being.  Home and relationship well-being.  Sexual activity.  Eating habits.  History of falls.  Memory and ability to understand (cognition).  Work and work Statistician.  Pregnancy and menstrual history. What immunizations do I need?  Influenza (flu) vaccine  This is recommended every year. Tetanus, diphtheria, and pertussis (Tdap) vaccine  You may need a Td booster every 10 years. Varicella (chickenpox) vaccine  You may need this vaccine if you have not already been vaccinated. Zoster (shingles) vaccine  You may need this after age 62. Pneumococcal conjugate (PCV13) vaccine  One dose is recommended after age 73. Pneumococcal polysaccharide (PPSV23) vaccine  One dose is recommended after age 67. Measles, mumps, and rubella (MMR) vaccine  You may need at least one dose of MMR if you were born in 1957 or later. You may also need a second dose. Meningococcal conjugate (MenACWY) vaccine  You may need this if you have certain conditions. Hepatitis A vaccine  You may need this if you have certain conditions or if you travel or work in places where you may be exposed to hepatitis A. Hepatitis B vaccine  You may need this if you have certain conditions or if you travel or work in places where you may be exposed to hepatitis B. Haemophilus influenzae type b (Hib) vaccine  You may need this if you have certain conditions. You may receive vaccines as individual doses or as more than one vaccine together in one shot (combination vaccines). Talk  with your health care provider about the risks and benefits of combination vaccines. What tests do I need? Blood tests  Lipid and cholesterol levels. These may be checked every 5 years, or more frequently depending on your overall health.  Hepatitis C test.  Hepatitis B test. Screening  Lung cancer screening. You may have this screening every year starting at age 29 if you have a 30-pack-year history of  smoking and currently smoke or have quit within the past 15 years.  Colorectal cancer screening. All adults should have this screening starting at age 50 and continuing until age 11. Your health care provider may recommend screening at age 108 if you are at increased risk. You will have tests every 1-10 years, depending on your results and the type of screening test.  Diabetes screening. This is done by checking your blood sugar (glucose) after you have not eaten for a while (fasting). You may have this done every 1-3 years.  Mammogram. This may be done every 1-2 years. Talk with your health care provider about how often you should have regular mammograms.  BRCA-related cancer screening. This may be done if you have a family history of breast, ovarian, tubal, or peritoneal cancers. Other tests  Sexually transmitted disease (STD) testing.  Bone density scan. This is done to screen for osteoporosis. You may have this done starting at age 53. Follow these instructions at home: Eating and drinking  Eat a diet that includes fresh fruits and vegetables, whole grains, lean protein, and low-fat dairy products. Limit your intake of foods with high amounts of sugar, saturated fats, and salt.  Take vitamin and mineral supplements as recommended by your health care provider.  Do not drink alcohol if your health care provider tells you not to drink.  If you drink alcohol: ? Limit how much you have to 0-1 drink a day. ? Be aware of how much alcohol is in your drink. In the [place] one drink equals one 12 oz bottle of beer (355 mL), one 5 oz glass of wine (148 mL), or one 1 oz glass of hard liquor (44 mL). Lifestyle  Take daily care of your teeth and gums.  Stay active. Exercise for at least 30 minutes on 5 or more days each week.  Do not use any products that contain nicotine or tobacco, such as cigarettes, e-cigarettes, and chewing tobacco. If you need help quitting, ask your health care  provider.  If you are sexually active, practice safe sex. Use a condom or other form of protection in order to prevent STIs (sexually transmitted infections).  Talk with your health care provider about taking a low-dose aspirin or statin. What's next?  Go to your health care provider once a year for a well check visit.  Ask your health care provider how often you should have your eyes and teeth checked.  Stay up to date on all vaccines. This information is not intended to replace advice given to you by your health care provider. Make sure you discuss any questions you have with your health care provider. Document Revised: 06/03/2018 Document Reviewed: 06/03/2018 Elsevier Patient Education  2020 [employer].

## 2020-02-22 NOTE — Progress Notes (Addendum)
I connected with Avielle today by telephone and verified that I am speaking with the correct person using two identifiers. Location patient: home Location provider: work Persons participating in the virtual visit: patient, Marine scientist.    I discussed the limitations, risks, security and privacy concerns of performing an evaluation and management service by telephone and the availability of in person appointments. I also discussed with the patient that there may be a patient responsible charge related to this service. The patient expressed understanding and verbally consented to this telephonic visit.    Interactive audio and video telecommunications were attempted between this provider and patient, however failed, due to patient having technical difficulties OR patient did not have access to video capability.  We continued and completed visit with audio only.  Some vital signs may be absent or patient reported.    Subjective:   Alyssa Murray is a 69 y.o. female who presents for Medicare Annual (Subsequent) preventive examination.  Review of Systems     Cardiac Risk Factors include: dyslipidemia;advanced age (>43men, >36 women)     Objective:     Advanced Directives 02/22/2020 02/17/2019 02/05/2018  Does Patient Have a Medical Advance Directive? Yes Yes No  Type of Paramedic of [place];Living will [place];Living will -  Does patient want to make changes to medical advance directive? No - Patient declined No - Patient declined -  Copy of [place] in Chart? Yes - validated most recent copy scanned in chart (See row information) No - copy requested -  Would patient like information on creating a medical advance directive? - - Yes (MAU/Ambulatory/Procedural Areas - Information given)    Current Medications (verified) Outpatient Encounter Medications as of 02/22/2020  Medication Sig   Calcium Carbonate-Vitamin D (CALCIUM 600+D HIGH  POTENCY PO)    Cholecalciferol (D3-1000) 25 MCG (1000 UT) tablet    loratadine (KLS ALLERCLEAR) 10 MG tablet Take 1 tablet by mouth daily as needed.   Magnesium 250 MG TABS Take 1 tablet by mouth daily.   Multiple Vitamins-Minerals (ONE-A-DAY WOMENS 50+ ADVANTAGE) TABS    pantoprazole (PROTONIX) 40 MG tablet Take 1 tablet by mouth 2 (two) times daily before a meal.   Turmeric Curcumin 500 MG CAPS    valACYclovir (VALTREX) 500 MG tablet Take 1 tablet (500 mg total) by mouth 2 (two) times daily as needed.   No facility-administered encounter medications on file as of 02/22/2020.    Allergies (verified) Dust mite extract and Tree extract   History: Past Medical History:  Diagnosis Date   Allergic rhinitis    HSV infection    L buttocks   HYPERGLYCEMIA, BORDERLINE 01/23/2010   Menopause 2005   Osteopenia    Palpitation    cardiac ablation secondary to palptiation done aprox 2000- now Asx   Past Surgical History:  Procedure Laterality Date   bilateral bunionectomy  2002   CARDIAC ELECTROPHYSIOLOGY [place]  around 2000       FOOT SURGERY Left 03/2013   bunion/hammer toe   MOUTH SURGERY  03-2012   implants   Family History  Problem Relation Age of Onset   Hypertension Mother    Lung cancer Mother    Endometrial cancer Mother        question off   Diabetes Other        G parents    Throat cancer Brother    Cancer Brother    Coronary artery disease Neg Hx  Stroke Neg Hx    Colon cancer Neg Hx    Breast cancer Neg Hx    Ovarian cancer Neg Hx    Stomach cancer Neg Hx    Social History   Socioeconomic History   Marital status: Divorced    Spouse name: Not on file   Number of children: 2   Years of education: Not on file   Highest education level: Not on file  Occupational History   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   Occupation: Copywriter, 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[employer]: [place] Use   Smoking status: Never Smoker    Smokeless tobacco: Never Used  Substance and Sexual Activity   Alcohol use: Yes    Comment: occasional/ Social   Drug use: No   Sexual activity: Yes  Other Topics Concern   Not on file  Social History Narrative   2 sons, they are in [place] the other in [place]-- pt                Social Determinants of Health   Financial Resource Strain: Low Risk    Difficulty of Paying Living Expenses: Not hard at all  Food Insecurity: No Food Insecurity   Worried About Charity fundraiser in the Last Year: Never true   Arboriculturist in the Last Year: Never true  Transportation Needs: No Transportation Needs   Lack of Transportation (Medical): No   Lack of Transportation (Non-Medical): No  Physical Activity:    Days of Exercise per Week: Not on file   Minutes of Exercise per Session: Not on file  Stress:    Feeling of Stress : Not on file  Social Connections:    Frequency of Communication with Friends and Family: Not on file   Frequency of Social Gatherings with Friends and Family: Not on file   Attends Religious Services: Not on file   Active Member of Clubs or Organizations: Not on file   Attends Archivist Meetings: Not on file   Marital Status: Not on file    Tobacco Counseling Counseling given: Not Answered   Clinical Intake: Pain : No/denies pain     Activities of Daily Living In your present state of health, do you have any difficulty performing the following activities: 02/22/2020  Hearing? N  Vision? N  Difficulty concentrating or making decisions? N  Walking or climbing stairs? N  Dressing or bathing? N  Doing errands, shopping? N  Preparing Food and eating ? N  Using the Toilet? N  In the past six months, have you accidently leaked urine? N  Do you have problems with loss of bowel control? N  Managing your Medications? N  Managing your Finances? N  Housekeeping or managing your Housekeeping? N  Some recent data might be hidden     Patient Care Team: Colon Branch, MD as PCP - General Carlean Purl Ofilia Neas, MD as Consulting Physician (Gastroenterology)  Indicate any recent Medical Services you may have received from other than [facility] providers in the past year (date may be approximate).     Assessment:   This is a routine wellness examination for Alyssa Murray.   Dietary issues and exercise activities discussed: Current Exercise Habits: The patient does not participate in regular exercise at present, Exercise limited by: None identified Diet (meal preparation, eat out, water intake, caffeinated beverages, dairy products, fruits and vegetables): well balanced  Goals      Maintain current healthy lifestyle       Depression Screen PHQ 2/9 Scores 02/22/2020 02/17/2019 02/05/2018 05/27/2017 05/22/2016 05/22/2015 05/11/2013  PHQ - 2 Score 0 0 0 0 0 0 0    Fall Risk Fall Risk  02/22/2020 02/17/2019 02/05/2018 05/27/2017 05/22/2016  Falls in the past year? 0 0 No No No  Number falls in past yr: 0 - - - -  Injury with Fall? 0 - - - -  Follow up Education provided;Falls prevention discussed - - - -    Any stairs in or around the home? Yes  If so, are there any without handrails? No  Home free of loose throw rugs in walkways, pet beds, electrical cords, etc? Yes  Adequate lighting in your home to reduce risk of falls? Yes   ASSISTIVE DEVICES UTILIZED TO PREVENT FALLS: none needed per pt   Cognitive Function: Ad8 score reviewed for issues: Issues making decisions:no Less interest in hobbies / activities:no Repeats questions, stories (family complaining):no Trouble using ordinary gadgets (microwave, computer, phone):no Forgets the month or year: no Mismanaging finances: no Remembering appts:no Daily problems with thinking and/or memory:no Ad8 score is=0         Immunizations Immunization History  Administered Date(s) Administered   Fluad Quad(high Dose 65+) 02/24/2019   Influenza Split 04/20/2012   Influenza, High  Dose Seasonal PF 03/11/2016, 04/10/2017, 05/31/2018   Influenza,inj,Quad PF,6+ Mos 03/08/2014, 05/22/2015   PFIZER SARS-COV-2 Vaccination 07/30/2019, 08/10/2019   Pneumococcal Conjugate-13 03/11/2016   Pneumococcal Polysaccharide-23 04/10/2017   Td 11/26/2006   Tdap 01/20/2017   Zoster 11/24/2011   Zoster Recombinat (Shingrix) 07/01/2017, 09/25/2017    TDAP status: Up to date Flu Vaccine status: Up to date Pneumococcal vaccine status: Up to date Covid-19 vaccine status: Completed vaccines  Qualifies for Shingles Vaccine? Yes   Zostavax completed Yes   Shingrix Completed?: Yes  Screening Tests Health Maintenance  Topic Date Due   INFLUENZA VACCINE  01/22/2020   MAMMOGRAM  08/09/2020   COLONOSCOPY  04/07/2021   TETANUS/TDAP  01/21/2027   DEXA SCAN  Completed   COVID-19 Vaccine  Completed   Hepatitis C Screening  Completed   PNA vac Low Risk Adult  Completed    Health Maintenance  Health Maintenance Due  Topic Date Due   INFLUENZA VACCINE  01/22/2020    Colorectal cancer screening: Completed 04/08/11. Repeat every 10 years Mammogram status: Completed 08/25/19. Repeat every year Bone Density status: Completed 02/18/18. Results reflect: Bone density results: OSTEOPENIA. Repeat every 2 years.  Lung Cancer Screening: (Low Dose CT Chest recommended if Age 38-80 years, 30 pack-year currently smoking OR have quit w/in 15years.) does not qualify.   Lung Cancer Screening Referral: na   Additional Screening:  Hepatitis C Screening: does qualify; Completed 05/19/15  Vision Screening: Recommended annual ophthalmology exams for early detection of glaucoma and other disorders of the eye.   Dental Screening: Recommended annual dental exams for proper oral hygiene  Community Resource Referral / Chronic Care Management: CRR required this visit?  No   CCM required this visit?  No      Plan:    Continue to eat heart healthy diet (full of fruits, vegetables, whole grains,  lean protein, water--limit salt, fat, and sugar intake) and increase physical activity as tolerated.  Continue doing brain stimulating activities (puzzles, reading, adult coloring books, staying active) to keep memory sharp.    I have personally reviewed and noted the following in the patients  chart:   Medical and social history Use of alcohol, tobacco or illicit drugs  Current medications and supplements Functional ability and status Nutritional status Physical activity Advanced directives List of other physicians Hospitalizations, surgeries, and ER visits in previous 12 months Vitals Screenings to include cognitive, depression, and falls Referrals and appointments  In addition, I have reviewed and discussed with patient certain preventive protocols, quality metrics, and best practice recommendations. A written personalized care plan for preventive services as well as general preventive health recommendations were provided to patient.   Due to this being a telephonic visit, the after visit summary with patients personalized plan was offered to patient via mail or my-chart.  Patient would like to access on my-chart.   Shela Nevin, [place]   02/22/2020   Reviwed and agree with assessment & plan of RN.  Mackie Pai, PA-C   Agree with Assessment & Plan of RN.  Mackie Pai, PA-C

## 2020-03-15 DIAGNOSIS — Z1159 Encounter for screening for other viral diseases: Secondary | ICD-10-CM | POA: Diagnosis not present

## 2020-03-15 DIAGNOSIS — Z20828 Contact with and (suspected) exposure to other viral communicable diseases: Secondary | ICD-10-CM | POA: Diagnosis not present

## 2020-03-15 DIAGNOSIS — U071 COVID-19: Secondary | ICD-10-CM | POA: Diagnosis not present

## 2020-03-15 DIAGNOSIS — R5383 Other fatigue: Secondary | ICD-10-CM | POA: Diagnosis not present

## 2020-03-16 DIAGNOSIS — U071 COVID-19: Secondary | ICD-10-CM | POA: Diagnosis not present

## 2020-03-16 DIAGNOSIS — R5383 Other fatigue: Secondary | ICD-10-CM | POA: Diagnosis not present

## 2020-03-19 IMAGING — DX DG CHEST 2V
2 series · 2 of 2 positions shown · non-contrast
Comparison: 12/31/2018.

CLINICAL DATA: InStent exam.

EXAM:
CHEST - 2 VIEW

[chest pa]
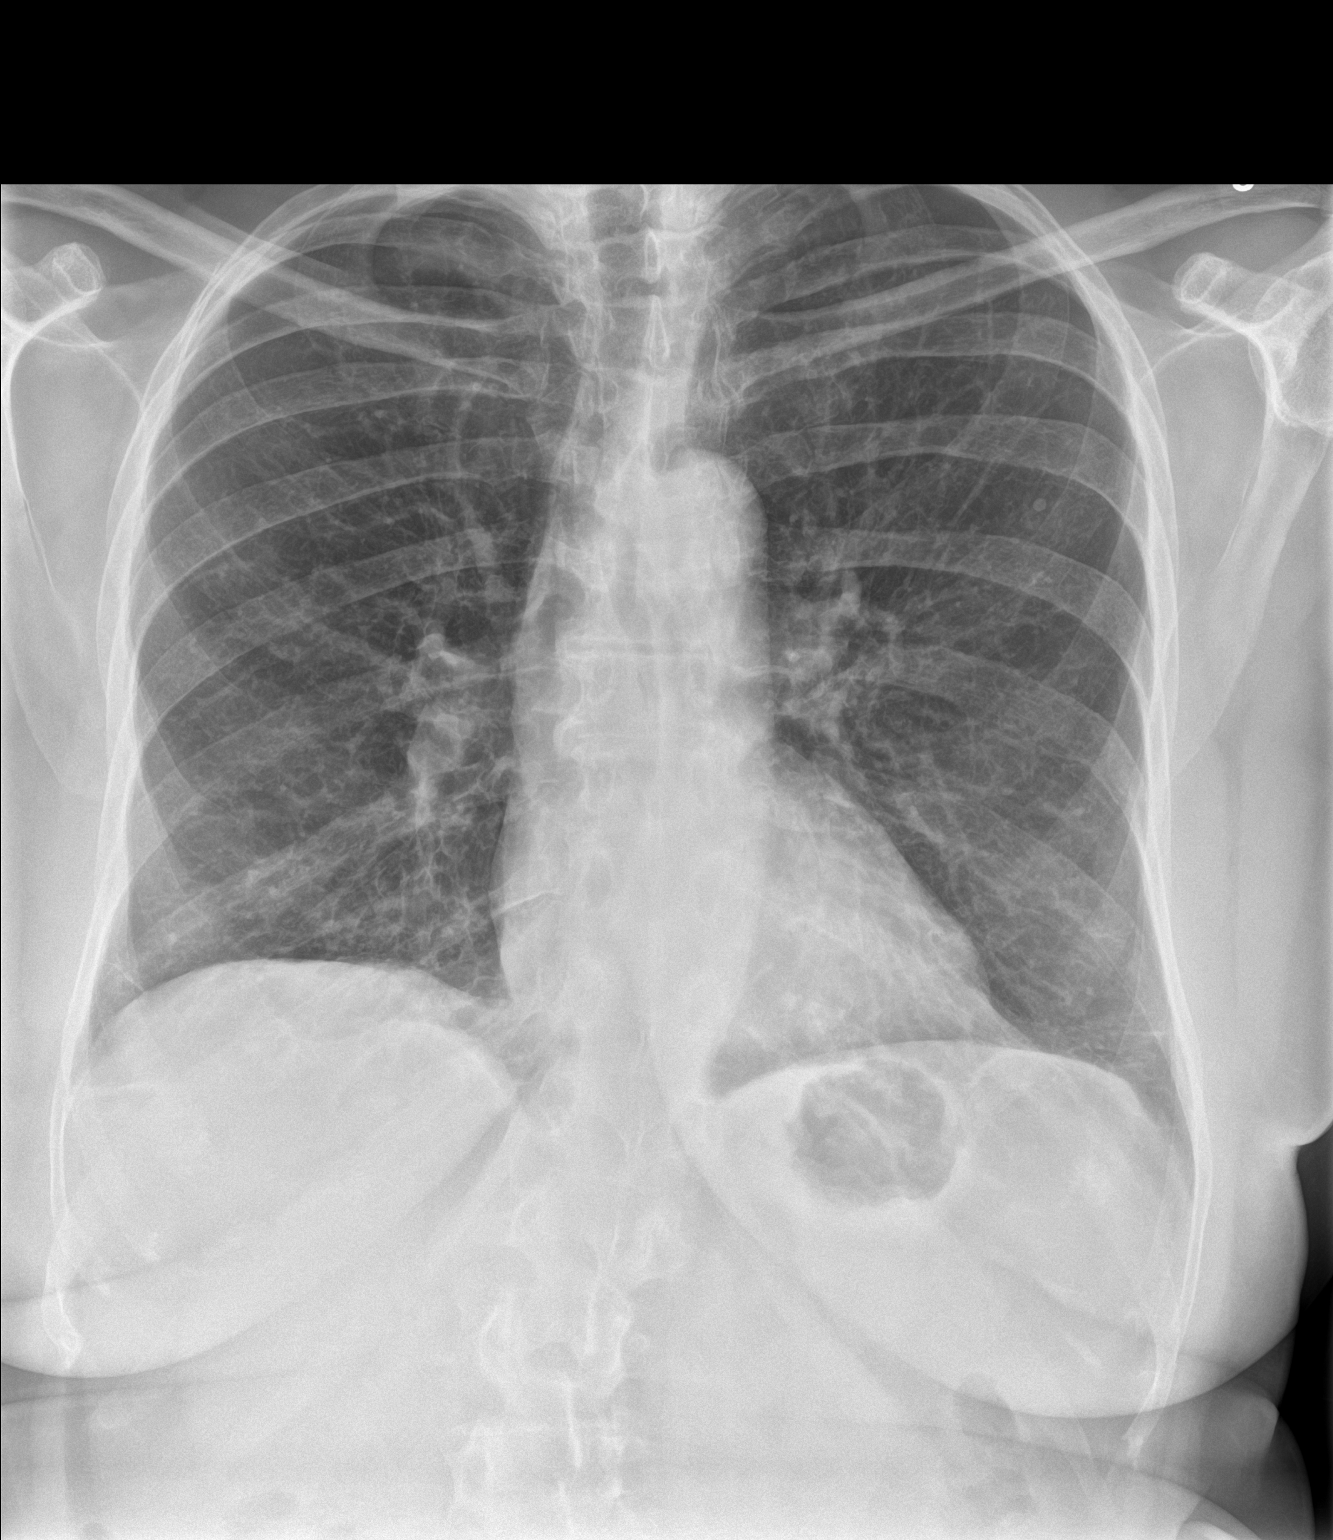

[chest lat]
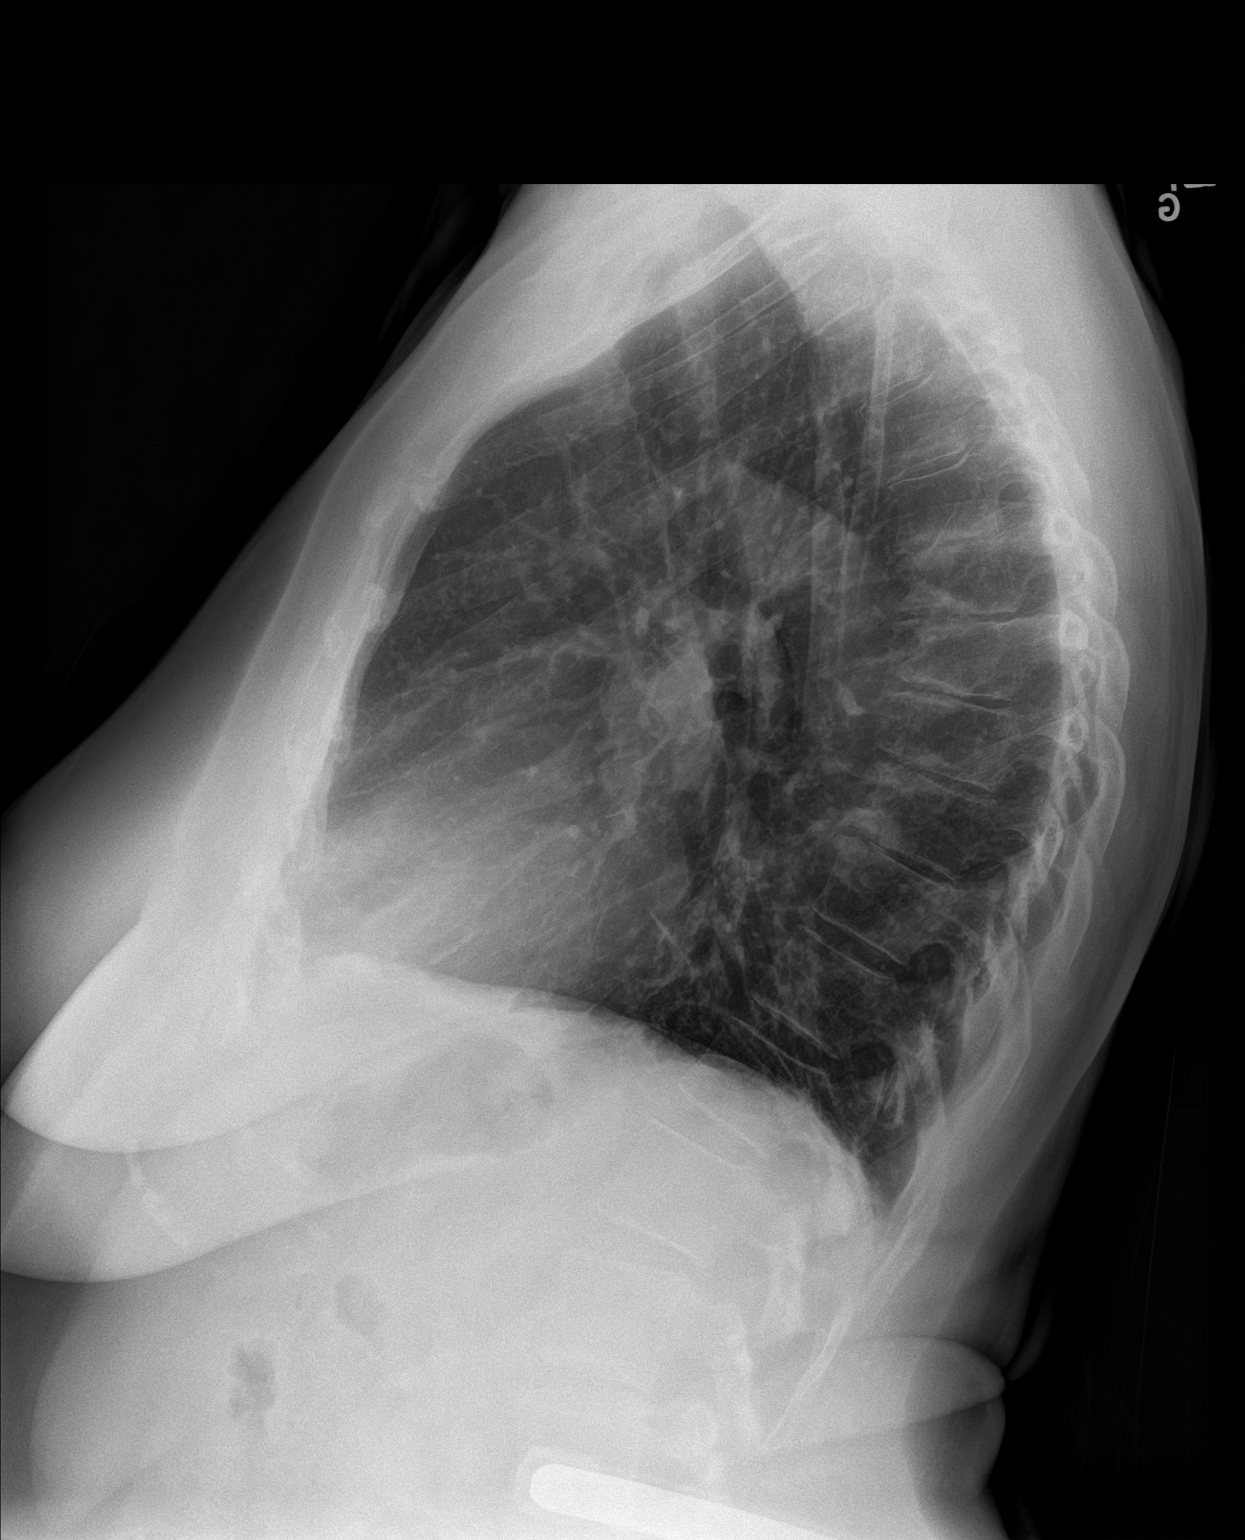

[2 of 2 positions shown; findings below may reference images not displayed]

FINDINGS: Mediastinum and hilar structures normal. Lungs are clear. Left upper
lung calcified nodule consistent with granuloma. No pleural effusion
or pneumothorax. Mild bibasilar subsegmental atelectasis and or
scarring. Thoracic spine scoliosis and degenerative change.
IMPRESSION: No acute cardiopulmonary disease.

## 2020-03-26 ENCOUNTER — Encounter: Payer: Self-pay | Admitting: Internal Medicine

## 2020-03-28 ENCOUNTER — Other Ambulatory Visit: Payer: Self-pay

## 2020-03-28 ENCOUNTER — Ambulatory Visit (INDEPENDENT_AMBULATORY_CARE_PROVIDER_SITE_OTHER): Payer: Medicare Other

## 2020-03-28 DIAGNOSIS — Z23 Encounter for immunization: Secondary | ICD-10-CM | POA: Diagnosis not present

## 2020-04-06 ENCOUNTER — Ambulatory Visit: Payer: Medicare Other

## 2020-04-10 DIAGNOSIS — Z23 Encounter for immunization: Secondary | ICD-10-CM | POA: Diagnosis not present

## 2020-04-18 ENCOUNTER — Encounter: Payer: Self-pay | Admitting: Internal Medicine

## 2020-04-30 ENCOUNTER — Other Ambulatory Visit: Payer: Self-pay

## 2020-04-30 ENCOUNTER — Telehealth (INDEPENDENT_AMBULATORY_CARE_PROVIDER_SITE_OTHER): Payer: Medicare Other | Admitting: Internal Medicine

## 2020-04-30 ENCOUNTER — Encounter: Payer: Self-pay | Admitting: Internal Medicine

## 2020-04-30 ENCOUNTER — Other Ambulatory Visit: Payer: Self-pay | Admitting: Internal Medicine

## 2020-04-30 VITALS — Ht 67.0 in | Wt 162.0 lb

## 2020-04-30 DIAGNOSIS — J019 Acute sinusitis, unspecified: Secondary | ICD-10-CM | POA: Diagnosis not present

## 2020-04-30 MED ORDER — AZELASTINE HCL 0.1 % NA SOLN: 2.0000 | Freq: Two times a day (BID) | NASAL | 1 refills | Status: DC

## 2020-04-30 MED ORDER — AMOXICILLIN 875 MG PO TABS: 875.0000 mg | ORAL_TABLET | Freq: Two times a day (BID) | ORAL | 0 refills | Status: DC

## 2020-04-30 NOTE — Progress Notes (Signed)
Subjective:    Patient ID: Alyssa Murray, female    DOB: 09-08-1950, 69 y.o.   MRN: [id number]  DOS:  04/30/2020 Type of visit - description: Virtual Visit via Video Note  I connected with the above patient  by a video enabled telemedicine application and verified that I am speaking with the correct person using two identifiers.   THIS ENCOUNTER IS A VIRTUAL VISIT DUE TO COVID-19 - PATIENT WAS NOT SEEN IN THE OFFICE. PATIENT HAS CONSENTED TO VIRTUAL VISIT / TELEMEDICINE VISIT   Location of patient: home  Location of provider: office  Persons participating in the virtual visit: patient, provider   I discussed the limitations of evaluation and management by telemedicine and the availability of in person appointments. The patient expressed understanding and agreed to proceed.  Acute  symptoms started about 2 weeks ago: Having pressure and congestion of the left frontal and maxillary sinus area. Is taking Flonase, allergy tablets OTC without much help    Denies fever chills No chest congestion + Mild cough No dental pain Unable to blow any discharge from the nose, no postnasal dripping No myalgias No rash or visual disturbances      Review of Systems See above Past Medical History:  Diagnosis Date  . Allergic rhinitis   . HSV infection    L buttocks  . HYPERGLYCEMIA, BORDERLINE 01/23/2010  . Menopause 2005  . Osteopenia   . Palpitation    cardiac ablation secondary to palptiation done aprox 2000- now Asx    Past Surgical History:  Procedure Laterality Date  . bilateral bunionectomy  2002  . CARDIAC ELECTROPHYSIOLOGY MAPPING AND ABLATION  around 2000      . FOOT SURGERY Left 03/2013   bunion/hammer toe  . MOUTH SURGERY  03-2012   implants    Social History   Socioeconomic History  . Marital status: Divorced    Spouse name: Not on file  . Number of children: 2  . Years of education: Not on file  . Highest education level: Not on file  Occupational History   . 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 Occupation: RETIRED  Research scientist (physical sciences) at [employer]: [employer]  Tobacco Use  . Smoking status: Never Smoker  . Smokeless tobacco: Never Used  Substance and Sexual Activity  . Alcohol use: Yes    Comment: occasional/ Social  . Drug use: No  . Sexual activity: Yes  Other Topics Concern  . Not on file  Social History Narrative   2 sons, they are in [place] the other in [place]-- pt                Social Determinants of Health   Financial Resource Strain: [place]   . Difficulty of Paying Living Expenses: Not hard at all  Food Insecurity: No Food Insecurity  . Worried About Charity fundraiser in the Last Year: Never true  . Ran Out of Food in the Last Year: Never true  Transportation Needs: No Transportation Needs  . Lack of Transportation (Medical): No  . Lack of Transportation (Non-Medical): No  Physical Activity:   . Days of Exercise per Week: Not on file  . Minutes of Exercise per Session: Not on file  Stress:   . Feeling of Stress : Not on file  Social Connections:   . Frequency of Communication with Friends and Family: Not on file  . Frequency of Social  Gatherings with Friends and Family: Not on file  . Attends Religious Services: Not on file  . Active Member of Clubs or Organizations: Not on file  . Attends Archivist Meetings: Not on file  . Marital Status: Not on file  Intimate Partner Violence:   . Fear of Current or Ex-Partner: Not on file  . Emotionally Abused: Not on file  . Physically Abused: Not on file  . Sexually Abused: Not on file      Allergies as of 04/30/2020      Reactions   Dust Mite Extract Cough, Other (See Comments)   Tree Extract Cough, Other (See Comments)      Medication List       Accurate as of April 30, 2020  1:46 PM. If you have any questions, ask your nurse or doctor.        CALCIUM 600+D HIGH POTENCY PO   D3-1000 25 MCG (1000 UT)  tablet Generic drug: Cholecalciferol   KLS AllerClear 10 MG tablet Generic drug: loratadine Take 1 tablet by mouth daily as needed.   Magnesium 250 MG Tabs Take 1 tablet by mouth daily.   One-A-Day Womens 50+ Advantage Tabs   pantoprazole 40 MG tablet Commonly known as: PROTONIX Take 1 tablet by mouth 2 (two) times daily before a meal.   Turmeric Curcumin 500 MG Caps   valACYclovir 500 MG tablet Commonly known as: VALTREX Take 1 tablet (500 mg total) by mouth 2 (two) times daily as needed.          Objective:   Physical Exam Ht 5\' 7"  (1.702 m)   Wt 162 lb (73.5 kg)   BMI 25.37 kg/m  A virtual video visit, she is alert oriented x3, seems in no distress. No vital signs available today but few days ago at the [facility] for blood pressure and temperature were normal    Assessment     Assessment Prediabetes Hyperlipidemia: rx lipitor 04-2016, good response, d/c 5/2-18 d/t elevated LFTs Cardiac ablation due to palpitations ~ 2000: now asx Mild anemia 2015, normal iron Osteopenia -- T score -1.5  (2014), T score -1.7 (01/2018), nl vit D 2017 Rash (prn  lotrisone, groin) HSV infection, left buttock  PLAN: Sinusitis: Clinical picture is of sinusitis, recommend amoxicillin, Flonase, Astelin, Mucinex. Call if no better in the next few days, round of prednisone?. Has an appointment in December.  I discussed the assessment and treatment plan with the patient. The patient was provided an opportunity to ask questions and all were answered. The patient agreed with the plan and demonstrated an understanding of the instructions.   The patient was advised to call back or seek an in-person evaluation if the symptoms worsen or if the condition fails to improve as anticipated.

## 2020-05-01 NOTE — Assessment & Plan Note (Signed)
Sinusitis: Clinical picture is of sinusitis, recommend amoxicillin, Flonase, Astelin, Mucinex. Call if no better in the next few days, round of prednisone?. Has an appointment in December.

## 2020-05-31 ENCOUNTER — Encounter: Payer: Medicare Other | Admitting: Internal Medicine

## 2020-06-19 ENCOUNTER — Other Ambulatory Visit: Payer: Self-pay

## 2020-06-19 ENCOUNTER — Encounter: Payer: Self-pay | Admitting: Internal Medicine

## 2020-06-19 ENCOUNTER — Ambulatory Visit (INDEPENDENT_AMBULATORY_CARE_PROVIDER_SITE_OTHER): Payer: Medicare Other | Admitting: Internal Medicine

## 2020-06-19 VITALS — BP 134/83 | HR 91 | Temp 98.3°F | Resp 16 | Ht 67.0 in | Wt 159.0 lb

## 2020-06-19 DIAGNOSIS — R053 Chronic cough: Secondary | ICD-10-CM

## 2020-06-19 DIAGNOSIS — J302 Other seasonal allergic rhinitis: Secondary | ICD-10-CM | POA: Diagnosis not present

## 2020-06-19 DIAGNOSIS — R739 Hyperglycemia, unspecified: Secondary | ICD-10-CM

## 2020-06-19 DIAGNOSIS — E785 Hyperlipidemia, unspecified: Secondary | ICD-10-CM

## 2020-06-19 LAB — COMPREHENSIVE METABOLIC PANEL
ALT: 23 U/L (ref 0–35)
AST: 23 U/L (ref 0–37)
Albumin: 3.9 g/dL (ref 3.5–5.2)
Alkaline Phosphatase: 52 U/L (ref 39–117)
BUN: 6 mg/dL (ref 6–23)
CO2: 25 mEq/L (ref 19–32)
Calcium: 9.7 mg/dL (ref 8.4–10.5)
Chloride: 106 mEq/L (ref 96–112)
Creatinine, Ser: 0.8 mg/dL (ref 0.40–1.20)
GFR: 75.11 mL/min (ref >60.00)
Glucose, Bld: 97 mg/dL (ref 70–99)
Potassium: 4.4 mEq/L (ref 3.5–5.1)
Sodium: 137 mEq/L (ref 135–145)
Total Bilirubin: 0.4 mg/dL (ref 0.2–1.2)
Total Protein: 7.4 g/dL (ref 6.0–8.3)

## 2020-06-19 LAB — LIPID PANEL
Cholesterol: 170 mg/dL (ref 0–200)
HDL: 62.1 mg/dL (ref >39.00)
LDL Cholesterol: 96 mg/dL (ref 0–99)
NonHDL: 107.55
Total CHOL/HDL Ratio: 3
Triglycerides: 59 mg/dL (ref 0.0–149.0)
VLDL: 11.8 mg/dL (ref 0.0–40.0)

## 2020-06-19 LAB — CBC WITH DIFFERENTIAL/PLATELET
Basophils Absolute: 0 10*3/uL (ref 0.0–0.1)
Basophils Relative: 0.7 % (ref 0.0–3.0)
Eosinophils Absolute: 0.3 10*3/uL (ref 0.0–0.7)
Eosinophils Relative: 3.9 % (ref 0.0–5.0)
HCT: 37.9 % (ref 36.0–46.0)
Hemoglobin: 12.2 g/dL (ref 12.0–15.0)
Lymphocytes Relative: 25.3 % (ref 12.0–46.0)
Lymphs Abs: 1.8 10*3/uL (ref 0.7–4.0)
MCHC: 32.2 g/dL (ref 30.0–36.0)
MCV: 73 fl — ABNORMAL LOW (ref 78.0–100.0)
Monocytes Absolute: 0.6 10*3/uL (ref 0.1–1.0)
Monocytes Relative: 8.7 % (ref 3.0–12.0)
Neutro Abs: 4.3 10*3/uL (ref 1.4–7.7)
Neutrophils Relative %: 61.4 % (ref 43.0–77.0)
Platelets: 277 10*3/uL (ref 150.0–400.0)
RBC: 5.19 Mil/uL — ABNORMAL HIGH (ref 3.87–5.11)
RDW: 17.3 % — ABNORMAL HIGH (ref 11.5–15.5)
WBC: 6.9 10*3/uL (ref 4.0–10.5)

## 2020-06-19 LAB — HEMOGLOBIN A1C: Hgb A1c MFr Bld: 6.2 % (ref 4.6–6.5)

## 2020-06-19 NOTE — Patient Instructions (Addendum)
For allergies: Claritin daily Astelin: 2 sprays on each side of the nose twice a day Flonase over-the-counter: 2 sprays on each side of the nose daily If with all of the above medicines  you are no better please call the office, will refer you to a specialist   GO TO THE LAB : Get the blood work     GO TO THE FRONT DESK, PLEASE SCHEDULE YOUR APPOINTMENTS Come back for  A check up in 6 months

## 2020-06-19 NOTE — Progress Notes (Signed)
Subjective:    Patient ID: Alyssa Murray, female    DOB: Dec 09, 1950, 69 y.o.   MRN: [id number]  DOS:  06/19/2020 Type of visit - description: Routine checkup  Since the last office visit in general feels well. Continue with sinus congestion, taking Claritin and Astelin once daily. Denies any fever chills. No itchy eyes or sneezing.  Chronic cough: About the same without chest pain or difficulty breathing. No nausea, vomiting, diarrhea  Review of Systems See above   Past Medical History:  Diagnosis Date  . Allergic rhinitis   . HSV infection    L buttocks  . HYPERGLYCEMIA, BORDERLINE 01/23/2010  . Menopause 2005  . Osteopenia   . Palpitation    cardiac ablation secondary to palptiation done aprox 2000- now Asx    Past Surgical History:  Procedure Laterality Date  . bilateral bunionectomy  2002  . CARDIAC ELECTROPHYSIOLOGY MAPPING AND ABLATION  around 2000      . FOOT SURGERY Left 03/2013   bunion/hammer toe  . MOUTH SURGERY  03-2012   implants    Allergies as of 06/19/2020      Reactions   Dust Mite Extract Cough, Other (See Comments)   Tree Extract Cough, Other (See Comments)      Medication List       Accurate as of June 19, 2020  8:08 PM. If you have any questions, ask your nurse or doctor.        STOP taking these medications   amoxicillin 875 MG tablet Commonly known as: AMOXIL Stopped by: Willow Ora, MD     TAKE these medications   azelastine 0.1 % nasal spray Commonly known as: ASTELIN Place 2 sprays into both nostrils 2 (two) times daily.   CALCIUM 600+D HIGH POTENCY PO   D3-1000 25 MCG (1000 UT) tablet Generic drug: Cholecalciferol   fluticasone 50 MCG/ACT nasal spray Commonly known as: FLONASE Place 2 sprays into both nostrils daily.   loratadine 10 MG tablet Commonly known as: CLARITIN Take 1 tablet by mouth daily as needed.   Magnesium 250 MG Tabs Take 1 tablet by mouth daily.   One-A-Day Womens 50+ Advantage Tabs    pantoprazole 40 MG tablet Commonly known as: PROTONIX Take 1 tablet by mouth 2 (two) times daily before a meal.   Turmeric Curcumin 500 MG Caps   valACYclovir 500 MG tablet Commonly known as: VALTREX Take 1 tablet (500 mg total) by mouth 2 (two) times daily as needed.          Objective:   Physical Exam BP 134/83 (BP Location: Right Arm, Patient Position: Sitting, Cuff Size: Small)   Pulse 91   Temp 98.3 F (36.8 C) (Oral)   Resp 16   Ht 5\' 7"  (1.702 m)   Wt 159 lb (72.1 kg)   SpO2 95%   BMI 24.90 kg/m  General:   Well developed, NAD, BMI noted.  HEENT:  Normocephalic . Face symmetric, atraumatic Neck: No thyromegaly Lungs:  CTA B Normal respiratory effort, no intercostal retractions, no accessory muscle use. Heart: RRR,  no murmur.  Abdomen:  Not distended, soft, non-tender. No rebound or rigidity.   Skin: Not pale. Not jaundice Lower extremities: no pretibial edema bilaterally  Neurologic:  alert & oriented X3.  Speech normal, gait appropriate for age and unassisted Psych--  Cognition and judgment appear intact.  Cooperative with normal attention span and concentration.  Behavior appropriate. No anxious or depressed appearing.     Assessment  Assessment Prediabetes Hyperlipidemia: rx lipitor 04-2016, good response, d/c 5/2-18 d/t elevated LFTs Cardiac ablation due to palpitations ~ 2000: now asx Mild anemia 2015, normal iron Osteopenia -- T score -1.5  (2014), T score -1.7 (01/2018), nl vit D 2017 Rash (prn  lotrisone, groin) HSV infection, left buttock Cough, chronic (was exposed to dust from 9/11 attack, f/u by a [facility] organization)  PLAN: Prediabetes: Check a A1c, doing well with lifestyle. Hyperlipidemia: Currently diet controlled, recheck CMP, FLP, CBC. Chronic cough: Sxs developed after she was exposed to dust from the September 11 attack, she is followed by the [facility] organization and sees a pulmonologist. Rhinitis: See last  visit, was treated for sinusitis, still has symptoms. Recommend to continue with Claritin, increase Astelin to twice daily, restart Flonase.  If symptoms not well controlled will call for an allergist referral. Preventive care reviewed RTC 6 months  This visit occurred during the SARS-CoV-2 public health emergency.  Safety protocols were in place, including screening questions prior to the visit, additional usage of staff PPE, and extensive cleaning of exam room while observing appropriate contact time as indicated for disinfecting solutions.

## 2020-06-19 NOTE — Assessment & Plan Note (Signed)
-  Td 2018 - zostavax 11-2011; s/p shingrix -prevnar 9-17, pnm 23 2018 - Covid vax x 3  - s/p flu shot -female care:  MMG 08/2019 ([facility]) PAP 07/2019 ([facility]) -[facility]: Colonoscopy --06/23/2000, in [place] , reportedly normal cscope 03-2011, polyps, tics, 10 years, Dr Leone Payor

## 2020-06-19 NOTE — Assessment & Plan Note (Signed)
Prediabetes: Check a A1c, doing well with lifestyle. Hyperlipidemia: Currently diet controlled, recheck CMP, FLP, CBC. Chronic cough: Sxs developed after she was exposed to dust from the September 11 attack, she is followed by the [facility] organization and sees a pulmonologist. Rhinitis: See last visit, was treated for sinusitis, still has symptoms. Recommend to continue with Claritin, increase Astelin to twice daily, restart Flonase.  If symptoms not well controlled will call for an allergist referral. Preventive care reviewed RTC 6 months

## 2020-06-19 NOTE — Progress Notes (Signed)
Pre visit review using our clinic review tool, if applicable. No additional management support is needed unless otherwise documented below in the visit note. 

## 2020-06-27 ENCOUNTER — Encounter: Payer: Self-pay | Admitting: Nurse Practitioner

## 2020-06-27 ENCOUNTER — Ambulatory Visit (INDEPENDENT_AMBULATORY_CARE_PROVIDER_SITE_OTHER): Payer: Medicare Other | Admitting: Nurse Practitioner

## 2020-06-27 VITALS — BP 124/70 | HR 76 | Ht 67.0 in | Wt 157.0 lb

## 2020-06-27 DIAGNOSIS — R1310 Dysphagia, unspecified: Secondary | ICD-10-CM

## 2020-06-27 DIAGNOSIS — Z86010 Personal history of colonic polyps: Secondary | ICD-10-CM

## 2020-06-27 DIAGNOSIS — R059 Cough, unspecified: Secondary | ICD-10-CM | POA: Diagnosis not present

## 2020-06-27 NOTE — Patient Instructions (Signed)
If you are age 70 or older, your body mass index should be between 23-30. Your Body mass index is 24.59 kg/m. If this is out of the aforementioned range listed, please consider follow up with your Primary Care Provider.  If you are age 43 or younger, your body mass index should be between 19-25. Your Body mass index is 24.59 kg/m. If this is out of the aformentioned range listed, please consider follow up with your Primary Care Provider.    You have been scheduled for an endoscopy and colonoscopy. Please follow the written instructions given to you at your visit today. Please pick up your prep supplies at the pharmacy within the next 1-3 days. If you use inhalers (even only as needed), please bring them with you on the day of your procedure.   It was great seeing you today!  Thank you for entrusting me with your care and choosing [facility].  Alcide Evener, NP

## 2020-06-27 NOTE — Progress Notes (Signed)
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  Past left bunionectomy surgery.  She presents to our office today as referred by Dr. Larose Kells for further evaluation regarding possible GERD.  She is followed by the [facility] health program and by a pulmonologist at [facility]. She was previously treated her cough with PPI twice daily for approximately 3 months during the summer 2021 for suspected silent GERD/laryngeal reflux.  She stated her cough did not improve after taking the PPI twice daily. She has infrequent heartburn.  She has dysphagia if she eats foods such as apples or meat which gets stuck in the upper esophagus when she drinks water and the stuck food passes down has been noticeable since 2016.  She coughs after she eats foods and she coughs when she is not eating.  Nonproductive cough.  She sometimes swallows food which feels as if it gets stuck then she develops itchiness to her esophagus which triggers coughing.  No specific food triggers.  She sleeps with the head of the bed elevated to prevent nighttime regurgitation.  No upper abdominal pain.  No NSAID use.  She uses Flonase nasal spray daily, no other steroid use.  No recent antibiotic use.  No lower abdominal pain.  She is passing a normal formed brown bowel movement most days.  No rectal bleeding or black stools.  Brother with history of colon polyps which required colon resection.  He also had throat cancer.  She underwent a colonoscopy by Dr. Arelia Longest 04/08/2011 which identified a diminutive polyp which was removed from the  sigmoid colon and severe diverticulosis to the left colon.  No other complaints at this time.  CBC Latest Ref Rng & Units 06/19/2020 06/07/2019 05/31/2018  WBC 4.0 - 10.5 K/uL 6.9 6.0 5.4  Hemoglobin 12.0 - 15.0 g/dL 12.2 13.0 13.0  Hematocrit 36.0 - 46.0 % 37.9 41.7 41.7  Platelets 150.0 - 400.0 K/uL 277.0 302.0 214.0   CMP Latest Ref Rng & Units 06/19/2020 07/20/2019 06/07/2019  Glucose 70 - 99 mg/dL 97 - 96  BUN 6 - 23 mg/dL 6 - 8  Creatinine 0.40 - 1.20 mg/dL 0.80 - 0.87  Sodium 135 - 145 mEq/L 137 - 138  Potassium 3.5 - 5.1 mEq/L 4.4 - 4.5  Chloride 96 - 112 mEq/L 106 - 105  CO2 19 - 32 mEq/L 25 - 28  Calcium 8.4 - 10.5 mg/dL 9.7 - 10.3  Total Protein 6.0 - 8.3 g/dL 7.4 - 7.7  Total Bilirubin 0.2 - 1.2 mg/dL 0.4 - 0.6  Alkaline Phos 39 - 117 U/L 52 - 55  AST 0 - 37 U/L 23 23 27   ALT 0 - 35 U/L 23 24 31     Past Medical History:  Diagnosis Date  . Allergic rhinitis   . HSV infection    L buttocks  . HYPERGLYCEMIA, BORDERLINE 01/23/2010  . Menopause 2005  . Osteopenia   . Palpitation    cardiac ablation secondary to palptiation done aprox 2000- now Asx   Past Surgical History:  Procedure Laterality Date  . bilateral bunionectomy  2002  . CARDIAC ELECTROPHYSIOLOGY MAPPING AND ABLATION  around 2000      . FOOT SURGERY Left 03/2013   bunion/hammer toe  . MOUTH SURGERY  03-2012   implants   Social History: She is divorced.  She has 2 sons.  She is retired, worked in [employer] for 20 years.  Non-smoker.  No alcohol use.  No drug use.  Family History: Brother with history of colon polyps which required a colon resection and throat cancer.  Mother with history of uterine cancer.  Allergies  Allergen Reactions  . Dust Mite Extract Cough and Other (See Comments)  . Tree Extract Cough and Other (See Comments)      Outpatient Encounter Medications as of 06/27/2020  Medication Sig  . azelastine (ASTELIN) 0.1 % nasal spray Place 2 sprays into both nostrils 2 (two) times daily.  .  Calcium Carbonate-Vitamin D (CALCIUM 600+D HIGH POTENCY PO)   . Cholecalciferol (D3-1000) 25 MCG (1000 UT) tablet   . fluticasone (FLONASE) 50 MCG/ACT nasal spray Place 2 sprays into both nostrils daily.  Marland Kitchen loratadine (CLARITIN) 10 MG tablet Take 1 tablet by mouth daily as needed.  . Magnesium 250 MG TABS Take 1 tablet by mouth daily.  . Multiple Vitamins-Minerals (ONE-A-DAY WOMENS 50+ ADVANTAGE) TABS   . Turmeric Curcumin 500 MG CAPS   . valACYclovir (VALTREX) 500 MG tablet Take 1 tablet (500 mg total) by mouth 2 (two) times daily as needed.   No facility-administered encounter medications on file as of 06/27/2020.    REVIEW OF SYSTEMS:  Gen: Denies fever, sweats or chills. No weight loss.  CV: Denies chest pain, palpitations or edema. Resp: + cough, no shortness of breath of hemoptysis.  GI: See HPI. GU : Denies urinary burning, blood in urine, increased urinary frequency or incontinence. MS: + arthritis pain. Derm: Denies rash, itchiness, skin lesions or unhealing ulcers. Psych: Denies depression, anxiety or memory loss. Heme: Denies bruising, bleeding. Neuro:  Denies headaches, dizziness or paresthesias. Endo:  Denies any problems with DM, thyroid or adrenal function.   PHYSICAL EXAM: BP 124/70   Pulse 76   Ht 5\' 7"  (1.702 m)   Wt 157 lb (71.2 kg)   SpO2 96%   BMI 24.59 kg/m  General: Well developed 70 year old female in no acute distress. Head: Normocephalic and atraumatic. Eyes:  Sclerae non-icteric, conjunctive pink. Ears: Normal auditory acuity. Mouth: Dentition intact. No ulcers or lesions.  Neck: Supple, no lymphadenopathy or thyromegaly.  Lungs: Clear bilaterally to auscultation without wheezes, crackles or rhonchi. Heart: Regular rate and rhythm. No murmur, rub or gallop appreciated.  Abdomen: Soft, nontender, non distended. No masses. No hepatosplenomegaly. Normoactive bowel sounds x 4 quadrants.  Rectal: Deferred. Musculoskeletal: Symmetrical with no gross  deformities. Skin: Warm and dry. No rash or lesions on visible extremities. Extremities: No edema. Neurological: Alert oriented x 4, no focal deficits.  Psychological:  Alert and cooperative. Normal mood and affect.  ASSESSMENT AND PLAN:  75.  70 year old female with a chronic cough, suspect GERD component.  Intermittent dysphagia. -EGD possible esophageal dilatation,  benefits and risks discussed including risk with sedation, risk of bleeding, perforation and infection  -She does not wish to initiate a PPI or H2 blocker, further recommendation to be determined after EGD completed  2.  History of hyperplastic colon polyps per colonoscopy 03/2011. Next colonoscopy due 03/2021.  -Patient prefers to schedule colonoscopy at the time of her EGD instead of 03/2021 -Colonoscopy benefits and risks discussed including risk with sedation,  risk of bleeding, perforation and infection   3. Chronic cough since dust/debris exposure to the [facility] 03/03/2000 (patient is a police office/first responder)  Further follow-up to be determined after the above evaluation completed             CC:  Colon Branch, MD

## 2020-08-06 ENCOUNTER — Other Ambulatory Visit: Payer: Self-pay | Admitting: Internal Medicine

## 2020-08-06 DIAGNOSIS — Z1231 Encounter for screening mammogram for malignant neoplasm of breast: Secondary | ICD-10-CM

## 2020-08-21 ENCOUNTER — Encounter: Payer: Self-pay | Admitting: Internal Medicine

## 2020-08-21 HISTORY — PX: ESOPHAGOGASTRODUODENOSCOPY: SHX1529

## 2020-08-22 ENCOUNTER — Other Ambulatory Visit (HOSPITAL_COMMUNITY): Payer: Self-pay | Admitting: Internal Medicine

## 2020-08-22 MED ORDER — AZELASTINE HCL 0.1 % NA SOLN: 2.0000 | Freq: Two times a day (BID) | NASAL | 5 refills | Status: DC

## 2020-08-29 ENCOUNTER — Ambulatory Visit (AMBULATORY_SURGERY_CENTER): Payer: Medicare Other | Admitting: Internal Medicine

## 2020-08-29 ENCOUNTER — Other Ambulatory Visit: Payer: Self-pay

## 2020-08-29 ENCOUNTER — Encounter: Payer: Self-pay | Admitting: Internal Medicine

## 2020-08-29 ENCOUNTER — Other Ambulatory Visit: Payer: Self-pay | Admitting: Internal Medicine

## 2020-08-29 VITALS — BP 107/70 | HR 92 | Temp 95.9°F | Resp 22 | Ht 67.0 in | Wt 157.0 lb

## 2020-08-29 DIAGNOSIS — Z1211 Encounter for screening for malignant neoplasm of colon: Secondary | ICD-10-CM

## 2020-08-29 DIAGNOSIS — D120 Benign neoplasm of cecum: Secondary | ICD-10-CM

## 2020-08-29 DIAGNOSIS — D124 Benign neoplasm of descending colon: Secondary | ICD-10-CM | POA: Diagnosis not present

## 2020-08-29 DIAGNOSIS — B3781 Candidal esophagitis: Secondary | ICD-10-CM

## 2020-08-29 DIAGNOSIS — Z86010 Personal history of colonic polyps: Secondary | ICD-10-CM | POA: Diagnosis not present

## 2020-08-29 DIAGNOSIS — R1310 Dysphagia, unspecified: Secondary | ICD-10-CM | POA: Diagnosis not present

## 2020-08-29 MED ORDER — SODIUM CHLORIDE 0.9 % IV SOLN: 500.0000 mL | Freq: Once | INTRAVENOUS | Status: DC

## 2020-08-29 MED ORDER — FLUCONAZOLE 100 MG PO TABS: ORAL_TABLET | ORAL | 0 refills | Status: DC

## 2020-08-29 NOTE — Op Note (Signed)
[facility] Patient Name: Alyssa Murray Procedure Date: 08/29/2020 9:03 AM MRN: [id number] Endoscopist: Gatha Mayer , MD Age: 70 Referring MD:  Date of Birth: 12/23/1950 Gender: Female Account #: [id number] Procedure:                Colonoscopy Indications:              Screening for colorectal malignant neoplasm, Last                            colonoscopy: 2012 Medicines:                Propofol per Anesthesia, Monitored Anesthesia Care Procedure:                Pre-Anesthesia Assessment:                           - Prior to the procedure, a History and Physical                            was performed, and patient medications and                            allergies were reviewed. The patient's tolerance of                            previous anesthesia was also reviewed. The risks                            and benefits of the procedure and the sedation                            options and risks were discussed with the patient.                            All questions were answered, and informed consent                            was obtained. Prior Anticoagulants: The patient has                            taken no previous anticoagulant or antiplatelet                            agents. ASA Grade Assessment: II - A patient with                            mild systemic disease. After reviewing the risks                            and benefits, the patient was deemed in                            satisfactory condition to undergo the procedure.  After obtaining informed consent, the colonoscope                            was passed under direct vision. Throughout the                            procedure, the patient's blood pressure, pulse, and                            oxygen saturations were monitored continuously. The                            Olympus PFC-[id number] (#[id number]) Colonoscope was                            introduced through the  anus and advanced to the the                            cecum, identified by appendiceal orifice and                            ileocecal valve. The colonoscopy was performed                            without difficulty. The patient tolerated the                            procedure well. The quality of the bowel                            preparation was adequate. The bowel preparation                            used was Miralax via split dose instruction. The                            ileocecal valve, appendiceal orifice, and rectum                            were photographed. Scope In: 9:14:33 AM Scope Out: 9:30:10 AM Scope Withdrawal Time: 0 hours 12 minutes 53 seconds  Total Procedure Duration: 0 hours 15 minutes 37 seconds  Findings:                 The perianal and digital rectal examinations were                            normal.                           Two sessile polyps were found in the descending                            colon and cecum. The polyps were diminutive in  size. These polyps were removed with a cold snare.                            Resection and retrieval were complete. Verification                            of patient identification for the specimen was                            done. Estimated blood loss was minimal.                           Many small and large-mouthed diverticula were found                            in the entire colon.                           The exam was otherwise without abnormality on                            direct and retroflexion views. Complications:            No immediate complications. Estimated Blood Loss:     Estimated blood loss was minimal. Impression:               - Two diminutive polyps in the descending colon and                            in the cecum, removed with a cold snare. Resected                            and retrieved.                           - Severe diverticulosis in  the entire examined                            colon.                           - The examination was otherwise normal on direct                            and retroflexion views. Recommendation:           - Patient has a contact number available for                            emergencies. The signs and symptoms of potential                            delayed complications were discussed with the                            patient. Return to normal activities tomorrow.  Written discharge instructions were provided to the                            patient.                           - Resume previous diet.                           - Continue present medications.                           - Repeat colonoscopy is recommended. The                            colonoscopy date will be determined after pathology                            results from today's exam become available for                            review. Gatha Mayer, MD 08/29/2020 9:45:30 AM This report has been signed electronically.

## 2020-08-29 NOTE — Progress Notes (Signed)
Called to room to assist during endoscopic procedure.  Patient ID and intended procedure confirmed with present staff. Received instructions for my participation in the procedure from the performing physician.  

## 2020-08-29 NOTE — Patient Instructions (Addendum)
You have some thrush or yeast in your esophagus.  That could be related to your swallowing problems.  It was very mild we often see this with inhalers but perhaps the nasal sprays you take for allergies could be related as well.  I have prescribed fluconazole for 2 weeks to treat that.  Lets see if that makes a difference.  I found and removed 2 small benign-appearing colon polyps.  You have a lot of diverticulosis which you had before.  As far as your swallowing and cough issues I think having a procedure called an esophageal manometry and 24-hour pH/impedance probe could be helpful to understand why you are having these issues.  I am not convinced it is from reflux though it could be.  I can explain more when I see your colon polyp results and I will contact you and discuss the options.   Esophageal manometry (muh-NOM-uh-tree) is a test that shows whether your esophagus is working properly. The esophagus is a long, muscular tube that connects your throat to your stomach. Esophageal manometry measures the rhythmic muscle contractions that occur in your esophagus when you swallow. The test also measures the force and coordination of esophageal muscles as they move food to your stomach. During esophageal manometry, a thin, flexible tube (catheter) that contains pressure sensors is passed through your nose, down your esophagus and into your stomach. Esophageal manometry can be helpful in diagnosing certain disorders that may affect your esophagus.  What is an Impedance study?     This new technology allows [place] to further evaluate the esophagus (feeding tube in the chest) when patients have symptoms that may or may not be related to acid reflux. This will help your doctor determine the cause of your symptoms.  There are other movements of air/gas and liquid (or a mixture) within the esophagus that may give you symptoms but are not caused by acid reflux. Impedance testing can tell us whether it is that (or  acid reflux) that is causing your symptoms.  How is the Impedance study performed?  Manometry is done first - this test involves placement of a soft catheter/tube through the nose and down into the esophagus. The nurse then asks you to take 10 swallows of water.  After that you leave the Endoscopy suite with the catheter/tube taped in place and you return the next day for removal of the tube and to bring back the recording device.  What preparation is required?   You may have a very light meal and/or just liquids up until 4 hours before your appointment but then you should not eat or drink anything for 4 hours before your appointment. You may take your usual morning medications. You should remain on all your usual medications before and during the test.  What should you expect during and after the procedure?   A thin flexible tube is lubricated and then gently passed through a nostril down to the correct level in the esophagus. You may experience gagging or coughing briefly during the insertion of the tube but it will soon pass. You will leave the Endoscopy suite with a tube coming out of your nose (and taped across your cheek).  How does it work?   You will carry a recording device with you and you will receive instructions on how to use that. The messages from the Impedance catheter/tube are sent to the recording device and after you return to the Endoscopy suite we remove the tube and download the information  from the recorder into the computer for the doctor to review.  What will you need to do?   We ask that you carry on with a normal day including eating and drinking normally as well as participating in your usual activities. We do not want you to avoid things that usually make you have your typical symptoms. You should stay on all your medications.  Your doctor will be sent the results of your study.  I appreciate the opportunity to care for you. Gatha Mayer, MD, FACG  YOU HAD  AN ENDOSCOPIC PROCEDURE TODAY AT [facility]:   Refer to the procedure report that was given to you for any specific questions about what was found during the examination.  If the procedure report does not answer your questions, please call your gastroenterologist to clarify.  If you requested that your care partner not be given the details of your procedure findings, then the procedure report has been included in a sealed envelope for you to review at your convenience later.  YOU SHOULD EXPECT: Some feelings of bloating in the abdomen. Passage of more gas than usual.  Walking can help get rid of the air that was put into your GI tract during the procedure and reduce the bloating. If you had a lower endoscopy (such as a colonoscopy or flexible sigmoidoscopy) you may notice spotting of blood in your stool or on the toilet paper. If you underwent a bowel prep for your procedure, you may not have a normal bowel movement for a few days.  Please Note:  You might notice some irritation and congestion in your nose or some drainage.  This is from the oxygen used during your procedure.  There is no need for concern and it should clear up in a day or so.  SYMPTOMS TO REPORT IMMEDIATELY:  Be sure to take your new medication as directed.   Following lower endoscopy (colonoscopy or flexible sigmoidoscopy):  Excessive amounts of blood in the stool  Significant tenderness or worsening of abdominal pains  Swelling of the abdomen that is new, acute  Fever of 100F or higher   Following upper endoscopy (EGD)  Vomiting of blood or coffee ground material  New chest pain or pain under the shoulder blades  Painful or persistently difficult swallowing  New shortness of breath  Fever of 100F or higher  Black, tarry-looking stools  For urgent or emergent issues, a gastroenterologist can be reached at any hour by calling [phone]. Do not use MyChart messaging for urgent concerns.    DIET:   We do recommend a small meal at first, but then you may proceed to your regular diet.  Drink plenty of fluids but you should avoid alcoholic beverages for 24 hours. Try to eat a high fiber diet, and drink plenty water.  ACTIVITY:  You should plan to take it easy for the rest of today and you should NOT DRIVE or use heavy machinery until tomorrow (because of the sedation medicines used during the test).    FOLLOW UP: Our staff will call the number listed on your records 48-72 hours following your procedure to check on you and address any questions or concerns that you may have regarding the information given to you following your procedure. If we do not reach you, we will leave a message.  We will attempt to reach you two times.  During this call, we will ask if you have developed any symptoms of COVID 19. If  you develop any symptoms (ie: fever, flu-like symptoms, shortness of breath, cough etc.) before then, please call [phone].  If you test positive for Covid 19 in the 2 weeks post procedure, please call and report this information to [place].    If any biopsies were taken you will be contacted by phone or by letter within the next 1-3 weeks.  Please call us at [phone] if you have not heard about the biopsies in 3 weeks.    SIGNATURES/CONFIDENTIALITY: You and/or your care partner have signed paperwork which will be entered into your electronic medical record.  These signatures attest to the fact that that the information above on your After Visit Summary has been reviewed and is understood.  Full responsibility of the confidentiality of this discharge information lies with you and/or your care-partner.

## 2020-08-29 NOTE — Progress Notes (Signed)
VS by [place]. 

## 2020-08-29 NOTE — Progress Notes (Signed)
To PACU, VSS. Report to [email or web address] 

## 2020-08-29 NOTE — Op Note (Signed)
[facility] Patient Name: Alyssa Murray Procedure Date: 08/29/2020 9:03 AM MRN: [id number] Endoscopist: Gatha Mayer , MD Age: 70 Referring MD:  Date of Birth: 1950-10-30 Gender: Female Account #: [id number] Procedure:                Upper GI endoscopy Indications:              Dysphagia, Chronic cough Medicines:                Propofol per Anesthesia, Monitored Anesthesia Care Procedure:                Pre-Anesthesia Assessment:                           - Prior to the procedure, a History and Physical                            was performed, and patient medications and                            allergies were reviewed. The patient's tolerance of                            previous anesthesia was also reviewed. The risks                            and benefits of the procedure and the sedation                            options and risks were discussed with the patient.                            All questions were answered, and informed consent                            was obtained. Prior Anticoagulants: The patient has                            taken no previous anticoagulant or antiplatelet                            agents. ASA Grade Assessment: II - A patient with                            mild systemic disease. After reviewing the risks                            and benefits, the patient was deemed in                            satisfactory condition to undergo the procedure.                           After obtaining informed consent, the endoscope was  passed under direct vision. Throughout the                            procedure, the patient's blood pressure, pulse, and                            oxygen saturations were monitored continuously. The                            Endoscope was introduced through the mouth, and                            advanced to the second part of duodenum. The upper                            GI  endoscopy was accomplished without difficulty.                            The patient tolerated the procedure well. Scope In: Scope Out: Findings:                 , white plaques were found in the upper third of                            the esophagus and in the middle third of the                            esophagus.                           The examined esophagus was mildly tortuous.                           The gastroesophageal flap valve was visualized                            endoscopically and classified as Hill Grade III                            (minimal fold, loose to endoscope, hiatal hernia                            likely).                           The exam was otherwise without abnormality.                           The cardia and gastric fundus were normal on                            retroflexion. Complications:            No immediate complications. Estimated Blood Loss:     Estimated blood loss: none. Impression:               -  Esophageal plaques were found, consistent with                            candidiasis.                           - Tortuous esophagus.                           - Gastroesophageal flap valve classified as Hill                            Grade III (minimal fold, loose to endoscope, hiatal                            hernia likely).                           - The examination was otherwise normal.                           - No specimens collected. Recommendation:           - Patient has a contact number available for                            emergencies. The signs and symptoms of potential                            delayed complications were discussed with the                            patient. Return to normal activities tomorrow.                            Written discharge instructions were provided to the                            patient.                           - Resume previous diet.                           - Continue  present medications.                           - Treat Candida with fluconazole x 14 days. She is                            on nasal Flonase - ? if that is cause. No inhalers.                           Would probably benefit from manometry and 24 hour                            impedance  to evaluate cough and dysphagia. I will                            contact her after pathology review (colon polyps)                            and discuss.                           - See the other procedure note for documentation of                            additional recommendations. Gatha Mayer, MD 08/29/2020 9:43:20 AM This report has been signed electronically.

## 2020-08-31 ENCOUNTER — Telehealth: Payer: Self-pay

## 2020-08-31 NOTE — Telephone Encounter (Signed)
  Follow up Call-  Call back number 08/29/2020  Post procedure Call Back phone  # [phone]  Permission to leave phone message Yes  Some recent data might be hidden     Patient questions:  Do you have a fever, pain , or abdominal swelling? No. Pain Score  0 *  Have you tolerated food without any problems? Yes.    Have you been able to return to your normal activities? Yes.    Do you have any questions about your discharge instructions: Diet   No. Medications  No. Follow up visit  No.  Do you have questions or concerns about your Care? No.  Actions: * If pain score is 4 or above: No action needed, pain <4.  1. Have you developed a fever since your procedure? no  2.   Have you had an respiratory symptoms (SOB or cough) since your procedure? no  3.   Have you tested positive for COVID 19 since your procedure no  4.   Have you had any family members/close contacts diagnosed with the COVID 19 since your procedure?  no   If yes to any of these questions please route to Joylene John, RN and Joella Prince, RN

## 2020-09-03 ENCOUNTER — Encounter: Payer: Self-pay | Admitting: Obstetrics & Gynecology

## 2020-09-03 ENCOUNTER — Ambulatory Visit (INDEPENDENT_AMBULATORY_CARE_PROVIDER_SITE_OTHER): Payer: Medicare Other | Admitting: Obstetrics & Gynecology

## 2020-09-03 ENCOUNTER — Other Ambulatory Visit: Payer: Self-pay

## 2020-09-03 VITALS — BP 132/72 | HR 99 | Ht 65.0 in | Wt 160.0 lb

## 2020-09-03 DIAGNOSIS — Z01419 Encounter for gynecological examination (general) (routine) without abnormal findings: Secondary | ICD-10-CM | POA: Diagnosis not present

## 2020-09-03 DIAGNOSIS — N951 Menopausal and female climacteric states: Secondary | ICD-10-CM

## 2020-09-03 DIAGNOSIS — A630 Anogenital (venereal) warts: Secondary | ICD-10-CM | POA: Diagnosis not present

## 2020-09-03 NOTE — Progress Notes (Addendum)
Subjective:     Alyssa Murray is a 70 y.o. female here for a routine exam.  Current complaints: no GYN complaints. H/o HPV- genital warts. No current lesions.     Gynecologic History No LMP recorded. Patient is postmenopausal. Contraception: post menopausal status Last Pap: 08/15/2019. Results were: normal Last mammogram: 08/25/2019. Results were: birad 2  Obstetric History OB History  Gravida Para Term Preterm AB Living  3 2 2   1 2   SAB IAB Ectopic Multiple Live Births  1       2    # Outcome Date GA Lbr Len/2nd Weight Sex Delivery Anes PTL Lv  3 Term 12/02/88    M Vag-Spont   LIV  2 Term 02/15/85 [redacted]w[redacted]d   M Vag-Spont   LIV  1 SAB 06/23/72             The following portions of the patient's history were reviewed and updated as appropriate: allergies, current medications, past family history, past medical history, past social history, past surgical history and problem list.  Review of Systems Pertinent items are noted in HPI.    Objective:  BP 132/72   Pulse 99   Ht 5\' 5"  (1.651 m)   Wt 160 lb (72.6 kg)   BMI 26.63 kg/m  General Appearance:    Alert, cooperative, no distress, appears stated age  Head:    Normocephalic, without obvious abnormality, atraumatic  Eyes:    conjunctiva/corneas clear, EOM's intact, both eyes  Ears:    Normal external ear canals, both ears  Nose:   Nares normal, septum midline, mucosa normal, no drainage    or sinus tenderness  Throat:   Lips, mucosa, and tongue normal; teeth and gums normal  Neck:   Supple, symmetrical, trachea midline, no adenopathy;    thyroid:  no enlargement/tenderness/nodules  Back:     Symmetric, no curvature, ROM normal, no CVA tenderness  Lungs:     respirations unlabored  Chest Wall:    No tenderness or deformity   Heart:    Regular rate and rhythm  Breast Exam:    No tenderness, masses, or nipple abnormality. The left nipple is inverted.   Abdomen:     Soft, non-tender, bowel sounds active all four quadrants,    no  masses, no organomegaly  Genitalia:    Normal female without lesion, discharge or tenderness     Extremities:   Extremities normal, atraumatic, no cyanosis or edema  Pulses:   2+ and symmetric all extremities  Skin:   Skin color, texture, turgor normal, no rashes or lesions     Diagnoses and all orders for this visit:  Well female exam with routine gynecological exam  Menopausal state  Genital warts  f/u in 1 year or sooner prn   Carolyn L. Harraway-Smith, M.D., Cherlynn June

## 2020-09-03 NOTE — Progress Notes (Signed)
Patient presents for annual exam. Had colonoscopy last week and has mammogram scheduled for 09-05-20 Kathrene Alu RN

## 2020-09-05 ENCOUNTER — Ambulatory Visit
Admission: RE | Admit: 2020-09-05 | Discharge: 2020-09-05 | Disposition: A | Discharge status: Home / Self Care | Payer: Medicare Other | Source: Ambulatory Visit

## 2020-09-05 ENCOUNTER — Other Ambulatory Visit: Payer: Self-pay

## 2020-09-05 DIAGNOSIS — Z1231 Encounter for screening mammogram for malignant neoplasm of breast: Secondary | ICD-10-CM | POA: Diagnosis not present

## 2020-09-11 ENCOUNTER — Encounter: Payer: Self-pay | Admitting: Internal Medicine

## 2020-09-11 DIAGNOSIS — Z86010 Personal history of colonic polyps: Secondary | ICD-10-CM

## 2020-09-11 HISTORY — DX: Personal history of colonic polyps: Z86.010

## 2020-09-12 ENCOUNTER — Telehealth: Payer: Self-pay | Admitting: Internal Medicine

## 2020-09-12 ENCOUNTER — Other Ambulatory Visit: Payer: Self-pay

## 2020-09-12 DIAGNOSIS — K219 Gastro-esophageal reflux disease without esophagitis: Secondary | ICD-10-CM

## 2020-09-12 DIAGNOSIS — R053 Chronic cough: Secondary | ICD-10-CM

## 2020-09-12 NOTE — Telephone Encounter (Signed)
Pt called to inform that procedure that is scheduled on 4/4 at [place] will be covered by [facility] and it needs PA. [facility] phone is: [phone]. Ext [phone]

## 2020-09-13 NOTE — Telephone Encounter (Signed)
Per Colletta [place] @ [facility] started.  May take up to two weeks to come back.  They have my direct number and fax number.  They will call if they need additional info. Phone number [phone] ext [phone] Lm on patient's vm with those details.

## 2020-09-13 NOTE — Telephone Encounter (Signed)
amb referral was placed yesterday.  Not sure if you need this information Amy.

## 2020-09-20 ENCOUNTER — Other Ambulatory Visit (HOSPITAL_COMMUNITY)
Admission: RE | Admit: 2020-09-20 | Discharge: 2020-09-20 | Disposition: A | Discharge status: Home / Self Care | Payer: Medicare Other | Source: Ambulatory Visit | Attending: Internal Medicine | Admitting: Internal Medicine

## 2020-09-20 DIAGNOSIS — Z01812 Encounter for preprocedural laboratory examination: Secondary | ICD-10-CM | POA: Insufficient documentation

## 2020-09-20 DIAGNOSIS — Z20822 Contact with and (suspected) exposure to covid-19: Secondary | ICD-10-CM | POA: Insufficient documentation

## 2020-09-20 LAB — SARS CORONAVIRUS 2 (TAT 6-24 HRS): SARS Coronavirus 2: NEGATIVE

## 2020-09-24 ENCOUNTER — Encounter (HOSPITAL_COMMUNITY)
Admission: RE | Disposition: A | Discharge status: Home / Self Care | Payer: Self-pay | Source: Home / Self Care | Attending: Internal Medicine

## 2020-09-24 ENCOUNTER — Ambulatory Visit (HOSPITAL_COMMUNITY)
Admission: RE | Admit: 2020-09-24 | Discharge: 2020-09-24 | Disposition: A | Discharge status: Home / Self Care | Payer: Medicare Other | Attending: Internal Medicine | Admitting: Internal Medicine

## 2020-09-24 ENCOUNTER — Encounter (HOSPITAL_COMMUNITY): Payer: Self-pay | Admitting: Internal Medicine

## 2020-09-24 ENCOUNTER — Other Ambulatory Visit: Payer: Self-pay

## 2020-09-24 DIAGNOSIS — K219 Gastro-esophageal reflux disease without esophagitis: Secondary | ICD-10-CM | POA: Diagnosis not present

## 2020-09-24 DIAGNOSIS — R059 Cough, unspecified: Secondary | ICD-10-CM

## 2020-09-24 HISTORY — PX: PH IMPEDANCE STUDY: SHX5565

## 2020-09-24 HISTORY — PX: ESOPHAGEAL MANOMETRY: SHX5429

## 2020-09-24 SURGERY — MANOMETRY, ESOPHAGUS
Anesthesia: Topical

## 2020-09-24 MED ORDER — LIDOCAINE VISCOUS HCL 2 % MT SOLN
OROMUCOSAL | Status: AC
  Filled 2020-09-24: qty 15

## 2020-09-24 SURGICAL SUPPLY — 2 items
FACESHIELD LNG OPTICON STERILE (SAFETY) IMPLANT
GLOVE BIO SURGEON STRL SZ8 (GLOVE) ×4 IMPLANT

## 2020-09-24 NOTE — Progress Notes (Signed)
Esophageal manometry performed per protocol without complications.  Patient tolerated well. Ph probe placed per protocol without complications at 36 cm.  Patient instructed on ph study and diary.  Patient verbalized understanding.  Patien to return to Endoscopy to have ph probe removed on 09/25/2020 at 1:40 p.m.

## 2020-09-26 ENCOUNTER — Encounter (HOSPITAL_COMMUNITY): Payer: Self-pay | Admitting: Internal Medicine

## 2020-09-26 DIAGNOSIS — H2513 Age-related nuclear cataract, bilateral: Secondary | ICD-10-CM | POA: Diagnosis not present

## 2020-09-26 DIAGNOSIS — H43811 Vitreous degeneration, right eye: Secondary | ICD-10-CM | POA: Diagnosis not present

## 2020-10-24 ENCOUNTER — Telehealth: Payer: Self-pay | Admitting: Internal Medicine

## 2020-10-24 NOTE — Telephone Encounter (Signed)
Inbound call from [place] with [facility] program who help patients pay for gastroenterology procedures.  She wants to know if patient was diagnosed with GERD.  Please advise.

## 2020-10-24 NOTE — Telephone Encounter (Signed)
Patient is involved with this program from 9/11 Alyssa Murray attacks.  Dr. Carlean Purl can I provider them with the diagnosis of GERD?  I spoke with the patient and she has given verbal permission for me to speak with them.

## 2020-10-24 NOTE — Telephone Encounter (Signed)
Yes she does have a dx of GERD

## 2020-10-24 NOTE — Telephone Encounter (Signed)
Inbound call from patient requesting a call back with her results from her last procedure please.

## 2020-10-24 NOTE — Telephone Encounter (Signed)
Patient is requesting the esophogeal mano report from 4/4.  Please advise

## 2020-10-25 MED ORDER — ESOMEPRAZOLE MAGNESIUM 40 MG PO CPDR
40.0000 mg | DELAYED_RELEASE_CAPSULE | Freq: Two times a day (BID) | ORAL | 0 refills | Status: DC
  Filled 2020-10-25: qty 60, 30d supply, fill #0

## 2020-10-25 NOTE — Telephone Encounter (Signed)
Patient notified of the results and that we will call with Dr. Celesta Aver recommendations.

## 2020-10-25 NOTE — Telephone Encounter (Signed)
Esophageal manometry is normal. pH study showed increased supine acid reflux with no correlation for cough. I gave a copy to Dr.Gessner. Thanks

## 2020-10-25 NOTE — Telephone Encounter (Signed)
I left a detailed message for Roshonda with patient's diagnosis and to call back for any additional questions or concerns.

## 2020-10-25 NOTE — Telephone Encounter (Signed)
I called her and we are going to try treatment of acid reflux.  I explained that there was not good symptom correlation but perhaps that would make a difference.  I neglected to mention to her that raising the head of her bed with a medical wedge would be useful as well since she had supine reflux.   I have prescribed generic Nexium 40 mg twice daily.  Please set up an appointment for her in about 2 months and let her know via MyChart message and also mention raising the head of the bed etc.

## 2020-10-26 ENCOUNTER — Other Ambulatory Visit (HOSPITAL_COMMUNITY): Payer: Self-pay

## 2020-10-26 MED ORDER — DEXLANSOPRAZOLE 60 MG PO CPDR
60.0000 mg | DELAYED_RELEASE_CAPSULE | Freq: Every day | ORAL | 2 refills | Status: DC
  Filled 2020-10-26: qty 30, 30d supply, fill #0

## 2020-10-26 MED FILL — Azelastine HCl Nasal Spray 0.1% (137 MCG/SPRAY): NASAL | 30 days supply | Qty: 30 | Fill #0 | Status: CN

## 2020-10-26 NOTE — Telephone Encounter (Signed)
I received a message from the pharmacist.  To do twice daily Nexium requires prior authorization. I have asked him if Dexilant is a possibility.  So please do not contact her yet

## 2020-10-26 NOTE — Telephone Encounter (Signed)
I changed her PPI plans to Dexilant 60 mg daily.  Please explained that this medicine works like a twice a day drug and she can take that before breakfast.  Give her the other information I asked you to do so as well.  Let me know if she has any questions.

## 2020-10-26 NOTE — Telephone Encounter (Signed)
Patient notified Follow up arranged for 7/19.

## 2020-10-29 ENCOUNTER — Other Ambulatory Visit (HOSPITAL_COMMUNITY): Payer: Self-pay

## 2020-10-29 MED FILL — Azelastine HCl Nasal Spray 0.1% (137 MCG/SPRAY): NASAL | 30 days supply | Qty: 30 | Fill #0 | Status: AC

## 2020-10-29 MED FILL — Azelastine HCl Nasal Spray 0.1% (137 MCG/SPRAY): NASAL | 30 days supply | Qty: 30 | Fill #0 | Status: CN

## 2020-10-30 DIAGNOSIS — R059 Cough, unspecified: Secondary | ICD-10-CM

## 2020-10-30 DIAGNOSIS — K219 Gastro-esophageal reflux disease without esophagitis: Secondary | ICD-10-CM

## 2020-11-14 ENCOUNTER — Encounter: Payer: Self-pay | Admitting: Internal Medicine

## 2020-11-29 DIAGNOSIS — Z20822 Contact with and (suspected) exposure to covid-19: Secondary | ICD-10-CM | POA: Diagnosis not present

## 2020-12-12 ENCOUNTER — Encounter: Payer: Self-pay | Admitting: Internal Medicine

## 2020-12-12 MED FILL — Azelastine HCl Nasal Spray 0.1% (137 MCG/SPRAY): NASAL | 30 days supply | Qty: 30 | Fill #1 | Status: AC

## 2020-12-13 ENCOUNTER — Other Ambulatory Visit (HOSPITAL_COMMUNITY): Payer: Self-pay

## 2020-12-13 DIAGNOSIS — Z20822 Contact with and (suspected) exposure to covid-19: Secondary | ICD-10-CM | POA: Diagnosis not present

## 2020-12-15 ENCOUNTER — Encounter: Payer: Self-pay | Admitting: Internal Medicine

## 2020-12-18 ENCOUNTER — Other Ambulatory Visit: Payer: Self-pay

## 2020-12-18 ENCOUNTER — Ambulatory Visit (INDEPENDENT_AMBULATORY_CARE_PROVIDER_SITE_OTHER): Payer: Medicare Other | Admitting: Internal Medicine

## 2020-12-18 ENCOUNTER — Other Ambulatory Visit (HOSPITAL_COMMUNITY): Payer: Self-pay

## 2020-12-18 VITALS — BP 106/70 | HR 101 | Temp 98.5°F | Resp 16 | Ht 65.0 in | Wt 155.5 lb

## 2020-12-18 DIAGNOSIS — J40 Bronchitis, not specified as acute or chronic: Secondary | ICD-10-CM | POA: Diagnosis not present

## 2020-12-18 DIAGNOSIS — R739 Hyperglycemia, unspecified: Secondary | ICD-10-CM

## 2020-12-18 LAB — CBC WITH DIFFERENTIAL/PLATELET
Basophils Absolute: 0.1 10*3/uL (ref 0.0–0.1)
Basophils Relative: 1.6 % (ref 0.0–3.0)
Eosinophils Absolute: 0.3 10*3/uL (ref 0.0–0.7)
Eosinophils Relative: 4.1 % (ref 0.0–5.0)
HCT: 38.7 % (ref 36.0–46.0)
Hemoglobin: 12.3 g/dL (ref 12.0–15.0)
Lymphocytes Relative: 23.7 % (ref 12.0–46.0)
Lymphs Abs: 1.8 10*3/uL (ref 0.7–4.0)
MCHC: 31.8 g/dL (ref 30.0–36.0)
MCV: 75 fl — ABNORMAL LOW (ref 78.0–100.0)
Monocytes Absolute: 0.9 10*3/uL (ref 0.1–1.0)
Monocytes Relative: 11.5 % (ref 3.0–12.0)
Neutro Abs: 4.5 10*3/uL (ref 1.4–7.7)
Neutrophils Relative %: 59.1 % (ref 43.0–77.0)
Platelets: 343 10*3/uL (ref 150.0–400.0)
RBC: 5.16 Mil/uL — ABNORMAL HIGH (ref 3.87–5.11)
RDW: 15.5 % (ref 11.5–15.5)
WBC: 7.5 10*3/uL (ref 4.0–10.5)

## 2020-12-18 LAB — BASIC METABOLIC PANEL
BUN: 7 mg/dL (ref 6–23)
CO2: 27 mEq/L (ref 19–32)
Calcium: 10.2 mg/dL (ref 8.4–10.5)
Chloride: 102 mEq/L (ref 96–112)
Creatinine, Ser: 0.77 mg/dL (ref 0.40–1.20)
GFR: 78.36 mL/min (ref >60.00)
Glucose, Bld: 98 mg/dL (ref 70–99)
Potassium: 4.5 mEq/L (ref 3.5–5.1)
Sodium: 136 mEq/L (ref 135–145)

## 2020-12-18 LAB — HEMOGLOBIN A1C: Hgb A1c MFr Bld: 6.5 % (ref 4.6–6.5)

## 2020-12-18 MED ORDER — DOXYCYCLINE HYCLATE 100 MG PO TABS
100.0000 mg | ORAL_TABLET | Freq: Two times a day (BID) | ORAL | 0 refills | Status: DC
  Filled 2020-12-18: qty 14, 7d supply, fill #0

## 2020-12-18 NOTE — Progress Notes (Signed)
Subjective:    Patient ID: Alyssa Murray, female    DOB: 1950-12-18, 70 y.o.   MRN: [id number]  DOS:  12/18/2020 Type of visit - description: Routine visit.  Here for routine visit. She returned from a cruise 12/08/2020. 2 days later she started to feel unwell: Cough, fever up to 100.3, sinus congestion. She did a home test for COVID and it was negative. 2 days later she checked again, this time a pharmacy   PCR test  was COVID-negative. Got  slightly hoarse, she has sore throat.  She start taking Tylenol and Mucinex.  Eventually this week she is feeling somewhat better. No more fever (taking Tylenol as needed) Cough has decreased, still has some white mucus production. Sinuses are still congested and particularly on the left side with clear nasal discharge.  Denies any nausea, vomiting or myalgias No rash or major headache  Review of Systems See above   Past Medical History:  Diagnosis Date   Allergic rhinitis    Allergy    GERD (gastroesophageal reflux disease)    HSV infection    L buttocks   Hx of adenomatous colonic polyps 09/11/2020   2 diminutive adenomas repeat 7 years approximately 2029   HYPERGLYCEMIA, BORDERLINE 01/23/2010   Menopause 2005   Osteopenia    Palpitation    cardiac ablation secondary to palptiation done aprox 2000- now Asx    Past Surgical History:  Procedure Laterality Date   bilateral bunionectomy  2002   CARDIAC ELECTROPHYSIOLOGY MAPPING AND ABLATION  around 2000       COLONOSCOPY W/ POLYPECTOMY  March 202203/2022   ESOPHAGEAL MANOMETRY N/A 09/24/2020   Procedure: ESOPHAGEAL MANOMETRY (EM);  Surgeon: Gatha Mayer, MD;  Location: [facility];  Service: Endoscopy;  Laterality: N/A;   ESOPHAGOGASTRODUODENOSCOPY  08/2020   FOOT SURGERY Left 03/2013   bunion/hammer toe   MOUTH SURGERY  03-2012   implants   PH IMPEDANCE STUDY N/A 09/24/2020   Procedure: [place] IMPEDANCE STUDY;  Surgeon: Gatha Mayer, MD;  Location: [facility];  Service:  Endoscopy;  Laterality: N/A;    Allergies as of 12/18/2020       Reactions   Dust Mite Extract Cough, Other (See Comments)   Tree Extract Cough, Other (See Comments)        Medication List        Accurate as of December 18, 2020  1:45 PM. If you have any questions, ask your nurse or doctor.          azelastine 0.1 % nasal spray Commonly known as: ASTELIN Place 2 sprays into both nostrils 2 (two) times daily.   azelastine 0.1 % nasal spray Commonly known as: ASTELIN PLACE 2 SPRAYS INTO BOTH NOSTRILS 2 (TWO) TIMES DAILY.   CALCIUM 600+D HIGH POTENCY PO   D3-1000 25 MCG (1000 UT) tablet Generic drug: Cholecalciferol   dexlansoprazole 60 MG capsule Commonly known as: Dexilant Take 1 capsule (60 mg total) by mouth daily.   fluconazole 100 MG tablet Commonly known as: DIFLUCAN TAKE 2 TABLETS BY MOUTH ON DAY 1 AND THEN 1 TABLET DAILY UNTIL COMPLETED COURSE   fluticasone 50 MCG/ACT nasal spray Commonly known as: FLONASE Place 2 sprays into both nostrils daily.   loratadine 10 MG tablet Commonly known as: CLARITIN Take 1 tablet by mouth daily as needed.   Magnesium 250 MG Tabs Take 1 tablet by mouth daily.   One-A-Day Womens 50+ Advantage Tabs   Turmeric Curcumin 500 MG Caps  Objective:   Physical Exam BP 106/70 (BP Location: Left Arm, Patient Position: Sitting, Cuff Size: Small)   Pulse (!) 101   Temp 98.5 F (36.9 C) (Oral)   Resp 16   Ht 5\' 5"  (1.651 m)   Wt 155 lb 8 oz (70.5 kg)   SpO2 95%   BMI 25.88 kg/m  General:   Well developed, NAD, BMI noted. HEENT:  Normocephalic . Face symmetric, atraumatic. Nose moderately congested, sinuses non-TTP TMs: R normal, L slightly bulge, minimal redness. Throat: Symmetric, no red. Lungs:  Few rhonchi with cough. Normal respiratory effort, no intercostal retractions, no accessory muscle use. Heart: RRR,  no murmur.  Lower extremities: no pretibial edema bilaterally  Skin: Not pale. Not  jaundice Neurologic:  alert & oriented X3.  Speech normal, gait appropriate for age and unassisted Psych--  Cognition and judgment appear intact.  Cooperative with normal attention span and concentration.  Behavior appropriate. No anxious or depressed appearing.      Assessment      Assessment Prediabetes Hyperlipidemia: rx lipitor 04-2016, good response, d/c 5/2-18 d/t elevated LFTs Cardiac ablation due to palpitations ~ 2000: now asx Mild anemia 2015, normal iron Osteopenia -- T score -1.5  (2014), T score -1.7 (01/2018), nl vit D 2017 Rash (prn  lotrisone, groin) HSV infection, left buttock Cough, chronic (was exposed to dust from 9/11 attack, f/u by a [facility] organization)  PLAN:  Routine visit Prediabetes: Check A1c and BMP. Bronchitis: The patient developed respiratory symptoms approximately 8 days ago, she had 3 COVID vaccines, 2 COVID tests negative, she did get exposed to a lot of people during a cruise and actually her sister came back COVID-positive (they went to the same cruise). At this point she has stable vital signs, on clinical grounds she has bronchitis and possibly early sinusitis. Plan: Doxycycline, Tylenol, Mucinex, monitor symptoms, call if no better.  Check a CBC, if it is very high consider a chest x-ray. See AVS RTC CPX December 2022.     This visit occurred during the SARS-CoV-2 public health emergency.  Safety protocols were in place, including screening questions prior to the visit, additional usage of staff PPE, and extensive cleaning of exam room while observing appropriate contact time as indicated for disinfecting solutions.

## 2020-12-18 NOTE — Patient Instructions (Signed)
I think you have bronchitis  Rest, drink plenty of fluids  Continue Mucinex or Mucinex DM  Take antibiotic called doxycycline  If you are not gradually better let me know, we may need to do a chest x-ray  If you are not back to normal in 2 weeks please call the office.     GO TO THE LAB : Get the blood work     [place], PLEASE SCHEDULE YOUR APPOINTMENTS Come back for physical exam by December 2022

## 2020-12-19 NOTE — Assessment & Plan Note (Signed)
Routine visit Prediabetes: Check A1c and BMP. Bronchitis: The patient developed respiratory symptoms approximately 8 days ago, she had 3 COVID vaccines, 2 COVID tests negative, she did get exposed to a lot of people during a cruise and actually her sister came back COVID-positive (they went to the same cruise). At this point she has stable vital signs, on clinical grounds she has bronchitis and possibly early sinusitis. Plan: Doxycycline, Tylenol, Mucinex, monitor symptoms, call if no better.  Check a CBC, if it is very high consider a chest x-ray. See AVS RTC CPX December 2022.

## 2021-01-07 ENCOUNTER — Other Ambulatory Visit (HOSPITAL_COMMUNITY): Payer: Self-pay

## 2021-01-07 ENCOUNTER — Other Ambulatory Visit: Payer: Self-pay | Admitting: Internal Medicine

## 2021-01-08 ENCOUNTER — Encounter: Payer: Self-pay | Admitting: Internal Medicine

## 2021-01-08 ENCOUNTER — Ambulatory Visit (INDEPENDENT_AMBULATORY_CARE_PROVIDER_SITE_OTHER): Payer: Medicare Other | Admitting: Internal Medicine

## 2021-01-08 VITALS — BP 113/60 | HR 101 | Ht 65.0 in | Wt 157.0 lb

## 2021-01-08 DIAGNOSIS — R059 Cough, unspecified: Secondary | ICD-10-CM

## 2021-01-08 DIAGNOSIS — K219 Gastro-esophageal reflux disease without esophagitis: Secondary | ICD-10-CM | POA: Diagnosis not present

## 2021-01-08 NOTE — Progress Notes (Signed)
Alyssa Murray 70 y.o. May 28, 1951 [id number]  Assessment & Plan:   Encounter Diagnoses  Name Primary?   Gastroesophageal reflux disease, unspecified whether esophagitis present Yes   Cough     She is improved at this point.  A brief course of Dexilant 60 mg was given and helped and she has made lifestyle modification changes that have significantly improved her symptoms.  She will continue to use over-the-counter acid reducer H2 blocker and lifestyle modifications and let me know if her problems flare and we could consider reinitiating PPI.  I appreciate the opportunity to care for this patient. CC: Alyssa Branch, MD   Subjective:   Chief Complaint: Reflux and cough  HPI Alyssa Murray is here for follow-up of cough problems.  She had a pH impedance study in April that demonstrated slightly elevated esophageal acid exposure with a DeMeester score of 15.6 were normal is less than 14.72.  Predominant esophageal acid exposure time and is in the supine position, slightly elevated prominently weakly acidic reflux episodes and no symptom correlation with the cough.  The impression was that of significant nocturnal or supine reflux. I had prescribed Dexilant to 60 mg daily in May after the results of the test came back. Today she tells me she is doing very well with intermittent use of H2 blocker and diet modifications raising the head of the bed.  Every once in a while she has had some vague dysphagia though she did not have a stricture or anything abnormal on her manometry to explain dysphagia, and she will take over-the-counter H2 blocker.  It is really vague and not discrete impact dysphagia.  Some occasional heartburn.  She is modifying her diet she is avoiding bread raw apples and paying attention to what she eats.  She reports raising the head of her bed has made a big difference in her cough is really not nearly what it was. Allergies  Allergen Reactions   Dust Mite Extract Cough and Other (See  Comments)   Tree Extract Cough and Other (See Comments)   Current Meds  Medication Sig   azelastine (ASTELIN) 0.1 % nasal spray Place 2 sprays into both nostrils 2 (two) times daily.   Calcium Carbonate-Vitamin D (CALCIUM 600+D HIGH POTENCY PO)    Cholecalciferol (D3-1000) 25 MCG (1000 UT) tablet    fluticasone (FLONASE) 50 MCG/ACT nasal spray Place 2 sprays into both nostrils daily.   loratadine (CLARITIN) 10 MG tablet Take 1 tablet by mouth daily as needed.   Magnesium 250 MG TABS Take 1 tablet by mouth daily.   Multiple Vitamins-Minerals (ONE-A-DAY WOMENS 50+ ADVANTAGE) TABS    Turmeric Curcumin 500 MG CAPS    Past Medical History:  Diagnosis Date   Allergic rhinitis    Allergy    GERD (gastroesophageal reflux disease)    HSV infection    L buttocks   Hx of adenomatous colonic polyps 09/11/2020   2 diminutive adenomas repeat 7 years approximately 2029   HYPERGLYCEMIA, BORDERLINE 01/23/2010   Menopause 2005   Osteopenia    Palpitation    cardiac ablation secondary to palptiation done aprox 2000- now Asx   Past Surgical History:  Procedure Laterality Date   bilateral bunionectomy  2002   CARDIAC ELECTROPHYSIOLOGY MAPPING AND ABLATION  around 2000       COLONOSCOPY W/ POLYPECTOMY  March 202203/2022   ESOPHAGEAL MANOMETRY N/A 09/24/2020   Procedure: ESOPHAGEAL MANOMETRY (EM);  Surgeon: Gatha Mayer, MD;  Location: [facility];  Service:  Endoscopy;  Laterality: N/A;   ESOPHAGOGASTRODUODENOSCOPY  08/2020   FOOT SURGERY Left 03/2013   bunion/hammer toe   MOUTH SURGERY  03-2012   implants   PH IMPEDANCE STUDY N/A 09/24/2020   Procedure: [place] IMPEDANCE STUDY;  Surgeon: Gatha Mayer, MD;  Location: [facility];  Service: Endoscopy;  Laterality: N/A;   Social History   Social History Narrative   2 sons, they are in [place] the other in [place]-- pt    She was present at the [employer] and has disabilities related to that   Rare to occasional alcohol never  smoker no drug use   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   Review of Systems As above  Objective:   Physical Exam BP 113/60   Pulse (!) 101   Ht 5\' 5"  (1.651 m)   Wt 157 lb (71.2 kg)   SpO2 98%   BMI 26.13 kg/m

## 2021-01-08 NOTE — Patient Instructions (Signed)
Glad things are improved. Good job on making the lifestyle changes.  Should you have a significant increase in reflux or swallowing problems let me know.  I appreciate the opportunity to care for you. Gatha Mayer, MD, Marval Regal

## 2021-01-10 ENCOUNTER — Encounter: Payer: Self-pay | Admitting: Internal Medicine

## 2021-01-11 ENCOUNTER — Other Ambulatory Visit: Payer: Self-pay | Admitting: Internal Medicine

## 2021-01-11 ENCOUNTER — Other Ambulatory Visit (HOSPITAL_COMMUNITY): Payer: Self-pay

## 2021-01-11 MED ORDER — OLOPATADINE HCL 0.6 % NA SOLN
2.0000 | Freq: Two times a day (BID) | NASAL | 6 refills | Status: DC
  Filled 2021-01-11: qty 30.5, 30d supply, fill #0
  Filled 2021-03-11: qty 30.5, 30d supply, fill #1
  Filled 2021-05-24: qty 30.5, 30d supply, fill #2
  Filled 2021-07-24: qty 30.5, 30d supply, fill #3
  Filled 2021-10-21: qty 30.5, 30d supply, fill #4
  Filled 2021-12-27: qty 30.5, 30d supply, fill #5

## 2021-01-14 ENCOUNTER — Other Ambulatory Visit (HOSPITAL_COMMUNITY): Payer: Self-pay

## 2021-02-08 DIAGNOSIS — E7800 Pure hypercholesterolemia, unspecified: Secondary | ICD-10-CM | POA: Diagnosis not present

## 2021-02-08 DIAGNOSIS — I340 Nonrheumatic mitral (valve) insufficiency: Secondary | ICD-10-CM | POA: Diagnosis not present

## 2021-02-08 DIAGNOSIS — I456 Pre-excitation syndrome: Secondary | ICD-10-CM | POA: Diagnosis not present

## 2021-02-13 DIAGNOSIS — E7800 Pure hypercholesterolemia, unspecified: Secondary | ICD-10-CM | POA: Diagnosis not present

## 2021-02-13 DIAGNOSIS — R002 Palpitations: Secondary | ICD-10-CM | POA: Diagnosis not present

## 2021-02-13 DIAGNOSIS — I349 Nonrheumatic mitral valve disorder, unspecified: Secondary | ICD-10-CM | POA: Diagnosis not present

## 2021-02-13 DIAGNOSIS — I456 Pre-excitation syndrome: Secondary | ICD-10-CM | POA: Diagnosis not present

## 2021-02-14 DIAGNOSIS — R262 Difficulty in walking, not elsewhere classified: Secondary | ICD-10-CM | POA: Diagnosis not present

## 2021-02-14 DIAGNOSIS — M25579 Pain in unspecified ankle and joints of unspecified foot: Secondary | ICD-10-CM | POA: Diagnosis not present

## 2021-02-14 DIAGNOSIS — I70223 Atherosclerosis of native arteries of extremities with rest pain, bilateral legs: Secondary | ICD-10-CM | POA: Diagnosis not present

## 2021-02-19 DIAGNOSIS — I349 Nonrheumatic mitral valve disorder, unspecified: Secondary | ICD-10-CM | POA: Diagnosis not present

## 2021-02-19 DIAGNOSIS — I369 Nonrheumatic tricuspid valve disorder, unspecified: Secondary | ICD-10-CM | POA: Diagnosis not present

## 2021-02-19 DIAGNOSIS — I456 Pre-excitation syndrome: Secondary | ICD-10-CM | POA: Diagnosis not present

## 2021-02-19 DIAGNOSIS — R000 Tachycardia, unspecified: Secondary | ICD-10-CM | POA: Diagnosis not present

## 2021-02-21 DIAGNOSIS — I70213 Atherosclerosis of native arteries of extremities with intermittent claudication, bilateral legs: Secondary | ICD-10-CM | POA: Diagnosis not present

## 2021-03-07 ENCOUNTER — Encounter: Payer: Self-pay | Admitting: Internal Medicine

## 2021-03-11 ENCOUNTER — Other Ambulatory Visit (HOSPITAL_COMMUNITY): Payer: Self-pay

## 2021-03-11 DIAGNOSIS — I456 Pre-excitation syndrome: Secondary | ICD-10-CM | POA: Diagnosis not present

## 2021-03-11 DIAGNOSIS — I340 Nonrheumatic mitral (valve) insufficiency: Secondary | ICD-10-CM | POA: Diagnosis not present

## 2021-03-11 DIAGNOSIS — E7800 Pure hypercholesterolemia, unspecified: Secondary | ICD-10-CM | POA: Diagnosis not present

## 2021-03-12 ENCOUNTER — Other Ambulatory Visit (HOSPITAL_COMMUNITY): Payer: Self-pay

## 2021-03-28 ENCOUNTER — Ambulatory Visit: Payer: Medicare Other | Admitting: Internal Medicine

## 2021-04-03 ENCOUNTER — Ambulatory Visit: Payer: Medicare Other | Attending: Internal Medicine

## 2021-04-03 ENCOUNTER — Encounter: Payer: Self-pay | Admitting: Internal Medicine

## 2021-04-03 ENCOUNTER — Ambulatory Visit (INDEPENDENT_AMBULATORY_CARE_PROVIDER_SITE_OTHER): Payer: Medicare Other | Admitting: Internal Medicine

## 2021-04-03 ENCOUNTER — Other Ambulatory Visit: Payer: Self-pay

## 2021-04-03 VITALS — BP 116/78 | HR 70 | Temp 98.2°F | Resp 16 | Ht 65.0 in | Wt 159.1 lb

## 2021-04-03 DIAGNOSIS — Z23 Encounter for immunization: Secondary | ICD-10-CM | POA: Diagnosis not present

## 2021-04-03 DIAGNOSIS — Z8489 Family history of other specified conditions: Secondary | ICD-10-CM | POA: Diagnosis not present

## 2021-04-03 DIAGNOSIS — R519 Headache, unspecified: Secondary | ICD-10-CM | POA: Diagnosis not present

## 2021-04-03 NOTE — Patient Instructions (Signed)
We will work on getting the MRI scheduled for you.  If the headache changes, get worse: Definitely let me know.  Otherwise rest, Tylenol.
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
## 2021-04-03 NOTE — Progress Notes (Signed)
   Covid-19 Vaccination Clinic  Name:  Alyssa Murray    MRN: [id number] DOB: 1950/07/18  04/03/2021  Alyssa Murray was observed post Covid-19 immunization for 15 minutes without incident. She was provided with Vaccine Information Sheet and instruction to access the V-Safe system.   Alyssa Murray was instructed to call 911 with any severe reactions post vaccine: Difficulty breathing  Swelling of face and throat  A fast heartbeat  A bad rash all over body  Dizziness and weakness

## 2021-04-06 ENCOUNTER — Ambulatory Visit (HOSPITAL_BASED_OUTPATIENT_CLINIC_OR_DEPARTMENT_OTHER)
Admission: RE | Admit: 2021-04-06 | Discharge: 2021-04-06 | Disposition: A | Discharge status: Home / Self Care | Payer: Medicare Other | Source: Ambulatory Visit | Attending: Internal Medicine | Admitting: Internal Medicine

## 2021-04-06 ENCOUNTER — Other Ambulatory Visit: Payer: Self-pay

## 2021-04-06 DIAGNOSIS — R519 Headache, unspecified: Secondary | ICD-10-CM | POA: Insufficient documentation

## 2021-04-06 DIAGNOSIS — J329 Chronic sinusitis, unspecified: Secondary | ICD-10-CM | POA: Diagnosis not present

## 2021-04-06 DIAGNOSIS — Z8489 Family history of other specified conditions: Secondary | ICD-10-CM | POA: Insufficient documentation

## 2021-04-06 MED ORDER — GADOBUTROL 1 MMOL/ML IV SOLN
7.0000 mL | Freq: Once | INTRAVENOUS | Status: AC | PRN
  Administered 2021-04-06: 7 mL via INTRAVENOUS

## 2021-04-11 DIAGNOSIS — E7800 Pure hypercholesterolemia, unspecified: Secondary | ICD-10-CM | POA: Diagnosis not present

## 2021-04-11 DIAGNOSIS — I456 Pre-excitation syndrome: Secondary | ICD-10-CM | POA: Diagnosis not present

## 2021-04-11 DIAGNOSIS — I340 Nonrheumatic mitral (valve) insufficiency: Secondary | ICD-10-CM | POA: Diagnosis not present

## 2021-04-25 ENCOUNTER — Other Ambulatory Visit (HOSPITAL_BASED_OUTPATIENT_CLINIC_OR_DEPARTMENT_OTHER): Payer: Self-pay

## 2021-04-25 MED ORDER — PFIZER COVID-19 VAC BIVALENT 30 MCG/0.3ML IM SUSP
INTRAMUSCULAR | 0 refills | Status: DC
Start: 2021-04-03 — End: 2021-06-19
  Filled 2021-04-25: qty 0.3, 1d supply, fill #0

## 2021-05-01 DIAGNOSIS — R079 Chest pain, unspecified: Secondary | ICD-10-CM | POA: Diagnosis not present

## 2021-05-01 DIAGNOSIS — I456 Pre-excitation syndrome: Secondary | ICD-10-CM | POA: Diagnosis not present

## 2021-05-01 DIAGNOSIS — R002 Palpitations: Secondary | ICD-10-CM | POA: Diagnosis not present

## 2021-05-01 DIAGNOSIS — R0600 Dyspnea, unspecified: Secondary | ICD-10-CM | POA: Diagnosis not present

## 2021-05-13 DIAGNOSIS — D485 Neoplasm of uncertain behavior of skin: Secondary | ICD-10-CM | POA: Diagnosis not present

## 2021-05-24 ENCOUNTER — Other Ambulatory Visit (HOSPITAL_COMMUNITY): Payer: Self-pay

## 2021-06-13 DIAGNOSIS — I340 Nonrheumatic mitral (valve) insufficiency: Secondary | ICD-10-CM | POA: Diagnosis not present

## 2021-06-13 DIAGNOSIS — E7800 Pure hypercholesterolemia, unspecified: Secondary | ICD-10-CM | POA: Diagnosis not present

## 2021-06-13 DIAGNOSIS — I456 Pre-excitation syndrome: Secondary | ICD-10-CM | POA: Diagnosis not present

## 2021-06-19 ENCOUNTER — Ambulatory Visit (INDEPENDENT_AMBULATORY_CARE_PROVIDER_SITE_OTHER): Payer: Medicare Other | Admitting: Internal Medicine

## 2021-06-19 ENCOUNTER — Encounter: Payer: Self-pay | Admitting: Internal Medicine

## 2021-06-19 VITALS — BP 122/70 | HR 88 | Temp 98.4°F | Resp 18 | Ht 65.0 in | Wt 162.5 lb

## 2021-06-19 DIAGNOSIS — E119 Type 2 diabetes mellitus without complications: Secondary | ICD-10-CM | POA: Diagnosis not present

## 2021-06-19 DIAGNOSIS — E785 Hyperlipidemia, unspecified: Secondary | ICD-10-CM

## 2021-06-19 LAB — MICROALBUMIN / CREATININE URINE RATIO
Creatinine,U: 195 mg/dL
Microalb Creat Ratio: 0.5 mg/g (ref 0.0–30.0)
Microalb, Ur: 0.9 mg/dL (ref 0.0–1.9)

## 2021-06-19 LAB — LIPID PANEL
Cholesterol: 191 mg/dL (ref 0–200)
HDL: 63.6 mg/dL (ref >39.00)
LDL Cholesterol: 111 mg/dL — ABNORMAL HIGH (ref 0–99)
NonHDL: 127.04
Total CHOL/HDL Ratio: 3
Triglycerides: 82 mg/dL (ref 0.0–149.0)
VLDL: 16.4 mg/dL (ref 0.0–40.0)

## 2021-06-19 LAB — AST: AST: 28 U/L (ref 0–37)

## 2021-06-19 LAB — TSH: TSH: 1.83 u[IU]/mL (ref 0.35–5.50)

## 2021-06-19 LAB — ALT: ALT: 34 U/L (ref 0–35)

## 2021-06-19 LAB — HEMOGLOBIN A1C: Hgb A1c MFr Bld: 6.3 % (ref 4.6–6.5)

## 2021-06-19 NOTE — Patient Instructions (Addendum)
Per our records you are due for your diabetic eye exam. Please contact your eye doctor to schedule an appointment. Please have them send copies of your office visit notes to [place]. Our fax number is [phone]. If you need a referral to an eye doctor please let us know.   GO TO THE LAB : Get the blood work     [place], [place] back for a checkup in 1 year

## 2021-06-19 NOTE — Assessment & Plan Note (Signed)
DM: Last A1c came back 6.5, explained patient this is consider DM, We are rechecking the A1c. Recommend the healthy diet, stay physically active , recheck A1c and micro. High cholesterol: Diet controlled, history of Lipitor intolerance due to increased LFTs.  Recheck FLP. Headache, see last visit, MRI pursue, within normal.  No further symptoms. Abnormal ABIs Had a ABI 02/21/2021 in  [place], "mild peripheral vascular disease R leg".  Report was scanned, she is asymptomatic Preventive care reviewed Social: Moving to [place] soon.  Still plans to come next year for a physical. RTC 1 year
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
## 2021-06-19 NOTE — Assessment & Plan Note (Signed)
-  Td 2018 - s/ zostavax  and s/p shingrix -prevnar 9-17, pnm 23 2018 - Covid vax : 03-2021, UTD - s/p flu shot  -female care:  MMG 08/2020 ([facility]) PAP 07/2019 ([facility]) -CCS: Colonoscopy --06/23/2000, in [place], cscope 03-2011, polyps, tics,  cscoope 08-2020, next per  Dr Carlean Purl  - POA on file

## 2021-06-21 ENCOUNTER — Other Ambulatory Visit (HOSPITAL_COMMUNITY): Payer: Self-pay

## 2021-06-21 MED ORDER — PRAVASTATIN SODIUM 20 MG PO TABS
20.0000 mg | ORAL_TABLET | Freq: Every day | ORAL | 1 refills | Status: DC
  Filled 2021-06-21: qty 30, 30d supply, fill #0

## 2021-06-28 ENCOUNTER — Encounter: Payer: Self-pay | Admitting: Internal Medicine

## 2021-07-01 ENCOUNTER — Other Ambulatory Visit (HOSPITAL_COMMUNITY): Payer: Self-pay

## 2021-07-01 ENCOUNTER — Other Ambulatory Visit: Payer: Self-pay | Admitting: Internal Medicine

## 2021-07-01 MED ORDER — ROSUVASTATIN CALCIUM 5 MG PO TABS
5.0000 mg | ORAL_TABLET | Freq: Every day | ORAL | 3 refills | Status: DC
  Filled 2021-07-01: qty 30, 30d supply, fill #0
  Filled 2021-08-06: qty 30, 30d supply, fill #1
  Filled 2021-09-09 – 2021-09-13 (×2): qty 30, 30d supply, fill #2

## 2021-07-05 ENCOUNTER — Other Ambulatory Visit: Payer: Self-pay

## 2021-07-05 ENCOUNTER — Ambulatory Visit (INDEPENDENT_AMBULATORY_CARE_PROVIDER_SITE_OTHER): Payer: Medicare Other | Admitting: Plastic Surgery

## 2021-07-05 ENCOUNTER — Encounter: Payer: Self-pay | Admitting: Plastic Surgery

## 2021-07-05 VITALS — BP 115/63 | HR 77 | Ht 65.0 in | Wt 159.6 lb

## 2021-07-05 DIAGNOSIS — D489 Neoplasm of uncertain behavior, unspecified: Secondary | ICD-10-CM

## 2021-07-05 NOTE — Progress Notes (Signed)
Referring Provider Glennie Isle, PA-C [facility] [address] [place],  [place] [address]   CC:  Right neck cyst  Alyssa Murray is an 71 y.o. female.  HPI: Patient is a 71 year old with a right neck cyst.  No pain.  It has drained in the past some white fluid.  She has never had a biopsy.  She has not seen dermatology about this.  Been present for 5 to 6 years but has gotten larger.  Allergies  Allergen Reactions   Dust Mite Extract Cough and Other (See Comments)   Tree Extract Cough and Other (See Comments)    Outpatient Encounter Medications as of 07/05/2021  Medication Sig   Calcium Carbonate-Vitamin D (CALCIUM 600+D HIGH POTENCY PO)    fluticasone (FLONASE) 50 MCG/ACT nasal spray Place 2 sprays into both nostrils daily.   Magnesium 250 MG TABS Take 1 tablet by mouth daily.   Multiple Vitamins-Minerals (ONE-A-DAY WOMENS 50+ ADVANTAGE) TABS    Olopatadine HCl 0.6 % SOLN Place 2 sprays into the nose 2 (two) times daily.   rosuvastatin (CRESTOR) 5 MG tablet Take 1 tablet (5 mg total) by mouth daily.   Turmeric Curcumin 500 MG CAPS    No facility-administered encounter medications on file as of 07/05/2021.     Past Medical History:  Diagnosis Date   Allergic rhinitis    Allergy    Diabetes mellitus without complication ([place])    GERD (gastroesophageal reflux disease)    HSV infection    L buttocks   Hx of adenomatous colonic polyps 09/11/2020   2 diminutive adenomas repeat 7 years approximately 2029   HYPERGLYCEMIA, BORDERLINE 01/23/2010   Menopause 2005   Osteopenia    Palpitation    cardiac ablation secondary to palptiation done aprox 2000- now Asx    Past Surgical History:  Procedure Laterality Date   bilateral bunionectomy  2002   CARDIAC ELECTROPHYSIOLOGY MAPPING AND ABLATION  around 2000       COLONOSCOPY W/ POLYPECTOMY  March 202203/2022   ESOPHAGEAL MANOMETRY N/A 09/24/2020   Procedure: ESOPHAGEAL MANOMETRY (EM);  Surgeon: Gatha Mayer,  MD;  [facility];  Service: Endoscopy;  Laterality: N/A;   ESOPHAGOGASTRODUODENOSCOPY  08/2020   FOOT SURGERY Left 03/2013   bunion/hammer toe   MOUTH SURGERY  03-2012   implants   PH IMPEDANCE STUDY N/A 09/24/2020   Procedure: [place] IMPEDANCE STUDY;  Surgeon: Gatha Mayer, MD;  [facility];  Service: Endoscopy;  Laterality: N/A;    Family History  Problem Relation Age of Onset   Hypertension Mother    Lung cancer Mother    Endometrial cancer Mother        question off   Diabetes Other        G parents    Throat cancer Brother    Cancer Brother    Esophageal cancer Brother    Coronary artery disease Neg Hx    Stroke Neg Hx    Colon cancer Neg Hx    Breast cancer Neg Hx    Ovarian cancer Neg Hx    Stomach cancer Neg Hx    Rectal cancer Neg Hx     Social History   Social History Narrative   2 sons, they are in [place] the other in [place]-- pt    She was present at the [employer] and has disabilities related to that   Rare to occasional alcohol never smoker no drug use  Retired as a Research scientist (physical sciences) at [employer]              Review of Systems General: Denies fevers, chills, weight loss CV: Denies chest pain, shortness of breath, palpitations   Physical Exam Vitals with BMI 07/05/2021 06/19/2021 04/03/2021  Height 5\' 5"  5\' 5"  5\' 5"   Weight 159 lbs 10 oz 162 lbs 8 oz 159 lbs 2 oz  BMI 26.56 49.82 64.15  Systolic 830 940 768  Diastolic 63 70 78  Pulse 77 88 70    General:  No acute distress,  Alert and oriented, Non-Toxic, Normal speech and affect HEENT: 1.5 cm left neck cystic lesion.  No significant erythema.  Lesion appears to be superficial  Assessment/Plan: Right neck lesion consistent with sebaceous cyst.  We will plan for excision under local and will send to pathology to confirm diagnosis.   15 minutes were spent with the patient.  Time was spent reviewing records, discussing surgical procedures with  the patient and reviewing risks and benefits.  We discussed the likely diagnosis and the planned procedure under local. Lennice Sites 07/05/2021, 1:50 PM

## 2021-07-08 ENCOUNTER — Encounter: Payer: Self-pay | Admitting: General Practice

## 2021-07-16 DIAGNOSIS — I456 Pre-excitation syndrome: Secondary | ICD-10-CM | POA: Diagnosis not present

## 2021-07-16 DIAGNOSIS — I340 Nonrheumatic mitral (valve) insufficiency: Secondary | ICD-10-CM | POA: Diagnosis not present

## 2021-07-16 DIAGNOSIS — E7800 Pure hypercholesterolemia, unspecified: Secondary | ICD-10-CM | POA: Diagnosis not present

## 2021-07-19 ENCOUNTER — Other Ambulatory Visit (HOSPITAL_COMMUNITY)
Admission: RE | Admit: 2021-07-19 | Discharge: 2021-07-19 | Disposition: A | Discharge status: Home / Self Care | Payer: Medicare Other | Source: Ambulatory Visit | Attending: Plastic Surgery | Admitting: Plastic Surgery

## 2021-07-19 ENCOUNTER — Ambulatory Visit (INDEPENDENT_AMBULATORY_CARE_PROVIDER_SITE_OTHER): Payer: Medicare Other | Admitting: Plastic Surgery

## 2021-07-19 ENCOUNTER — Other Ambulatory Visit: Payer: Self-pay

## 2021-07-19 VITALS — BP 123/78 | HR 86 | Ht 65.0 in | Wt 160.6 lb

## 2021-07-19 DIAGNOSIS — D489 Neoplasm of uncertain behavior, unspecified: Secondary | ICD-10-CM

## 2021-07-19 DIAGNOSIS — L920 Granuloma annulare: Secondary | ICD-10-CM | POA: Diagnosis not present

## 2021-07-19 DIAGNOSIS — L720 Epidermal cyst: Secondary | ICD-10-CM | POA: Diagnosis not present

## 2021-07-19 NOTE — Progress Notes (Signed)
Operative Note   DATE OF OPERATION: 07/19/2021  LOCATION:  right neck  SURGICAL DEPARTMENT: Plastic Surgery  PREOPERATIVE DIAGNOSES:  right neck cystic lesion  POSTOPERATIVE DIAGNOSES:  same  PROCEDURE:  Excision of right neck cyst 1.9 cm 1.9 cm intermediate closure  SURGEON: Harlene Petralia P. Domino Holten, MD  ANESTHESIA:  Local  COMPLICATIONS: None.   INDICATIONS FOR PROCEDURE:  The patient, Alyssa Murray is a 71 y.o. female born on 11/10/1950, is here for treatment of right neck cystic lesion. MRN: [id number]  CONSENT:  Informed consent was obtained directly from the patient. Risks, benefits and alternatives were fully discussed. Specific risks including but not limited to bleeding, infection, hematoma, seroma, scarring, pain, infection, wound healing problems, and need for further surgery were all discussed. The patient did have an ample opportunity to have questions answered to satisfaction.   DESCRIPTION OF PROCEDURE:  Local anesthesia was administered. The patient's operative site was prepped and draped in a sterile fashion. A time out was performed and all information was confirmed to be correct.  The lesion was excised with a 15 blade.  Hemostasis was obtained.  Circumferential undermining was performed and the skin was advanced and closed in layers with interrupted buried Monocryl sutures and 5-0 prolene for the skin.  The lesion excised measured 1.9 cm, and the total length of closure measured 1.9 cm.    The patient tolerated the procedure well.  There were no complications.

## 2021-07-21 IMAGING — MG MM DIGITAL SCREENING BILAT W/ TOMO AND CAD
8 series · 8 of 24 positions shown · non-contrast
Comparison: Previous exam(s).

CLINICAL DATA: Screening.

EXAM:
DIGITAL SCREENING BILATERAL MAMMOGRAM WITH TOMOSYNTHESIS AND CAD
TECHNIQUE: Bilateral screening digital craniocaudal and mediolateral oblique
mammograms were obtained. Bilateral screening digital breast
tomosynthesis was performed. The images were evaluated with
computer-aided detection.

[L CC synth-2D]
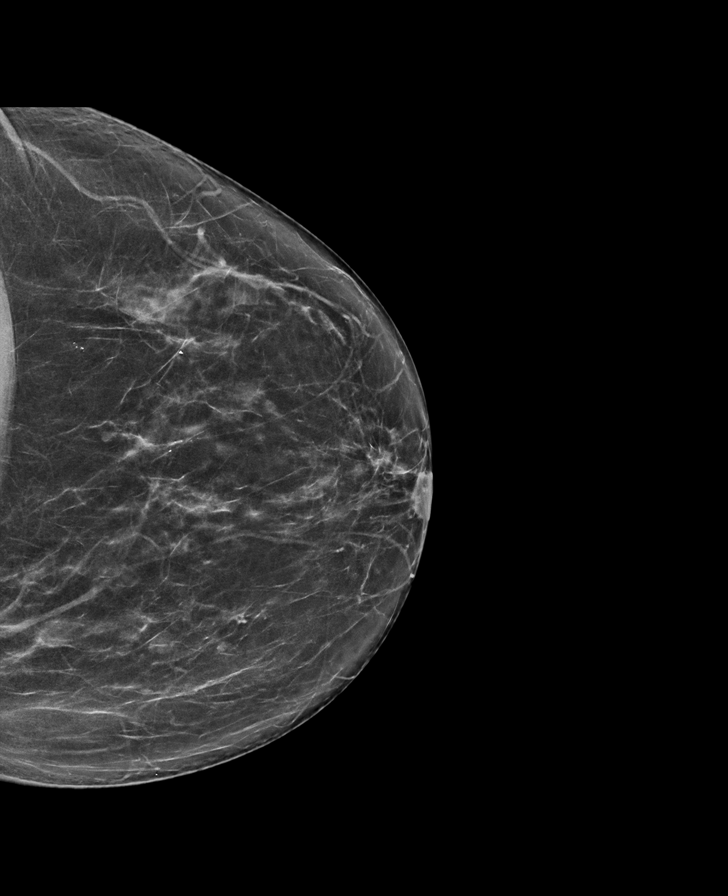

[L MLO synth-2D]
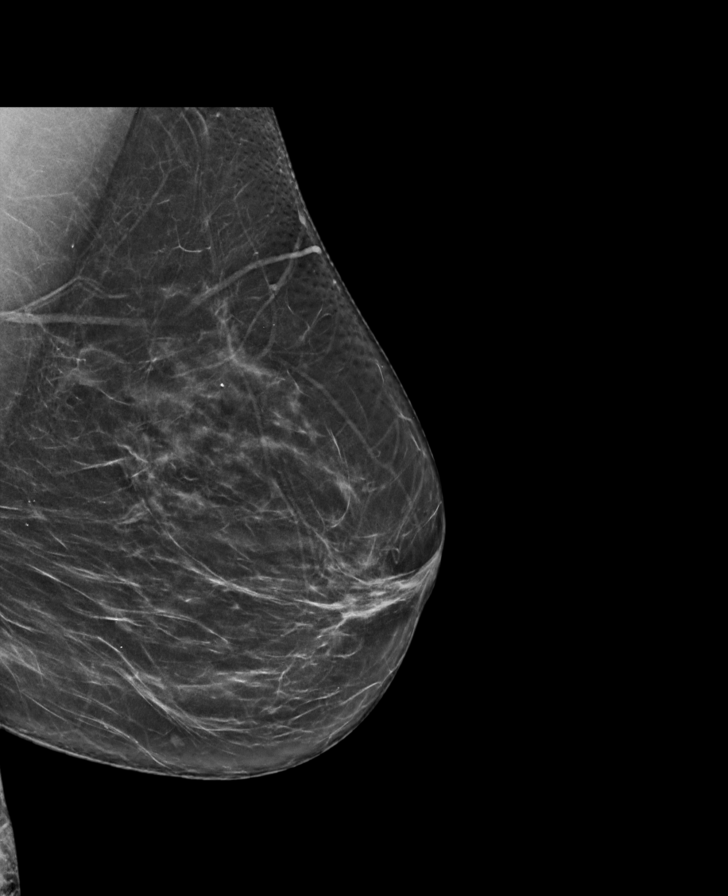

[R CC synth-2D]
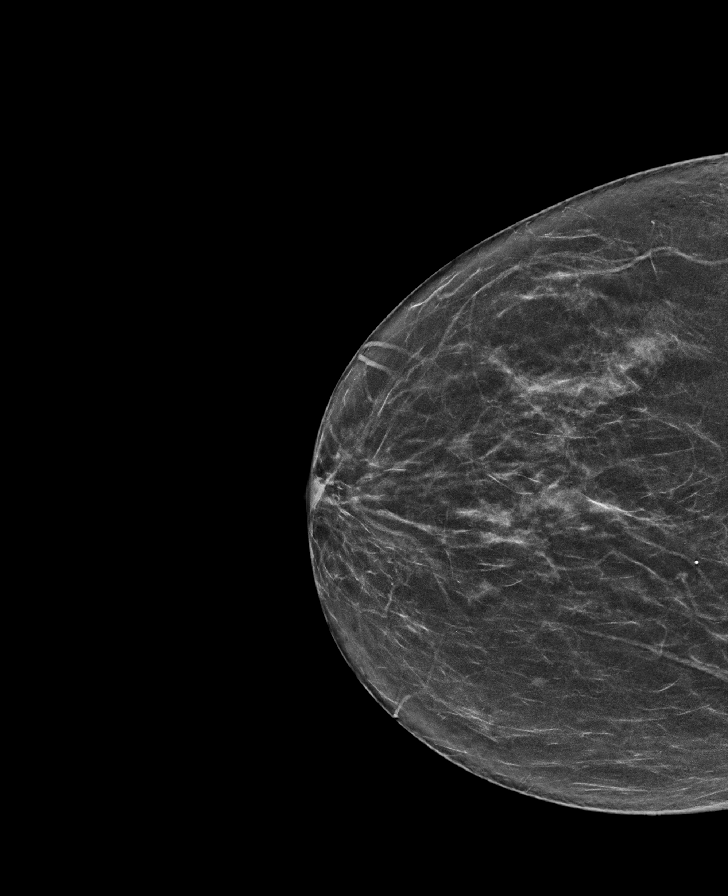

[R MLO synth-2D]
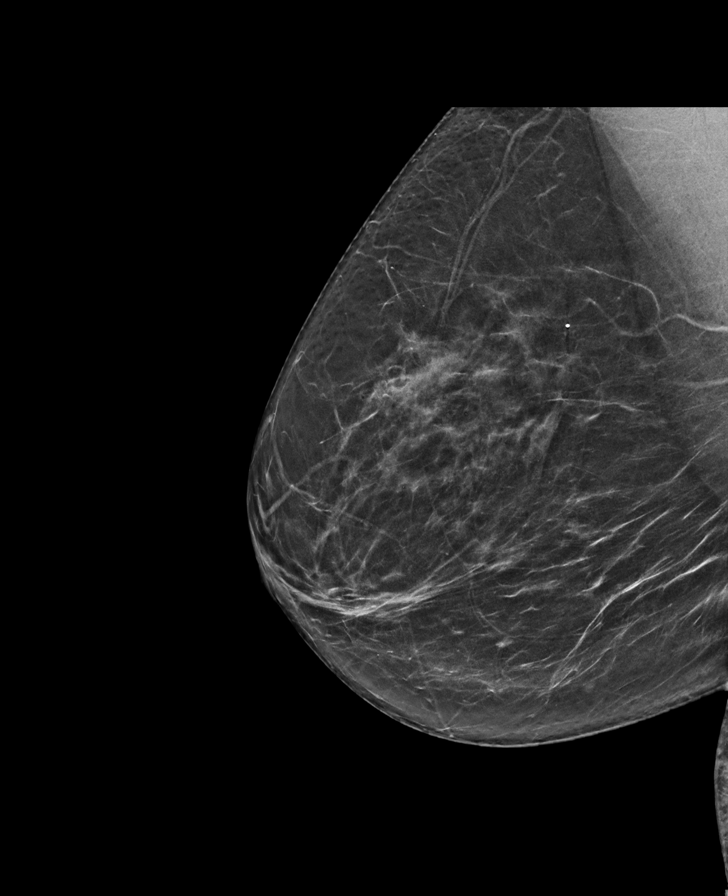

[R MLO tomo · tomo slice 35/70.0]
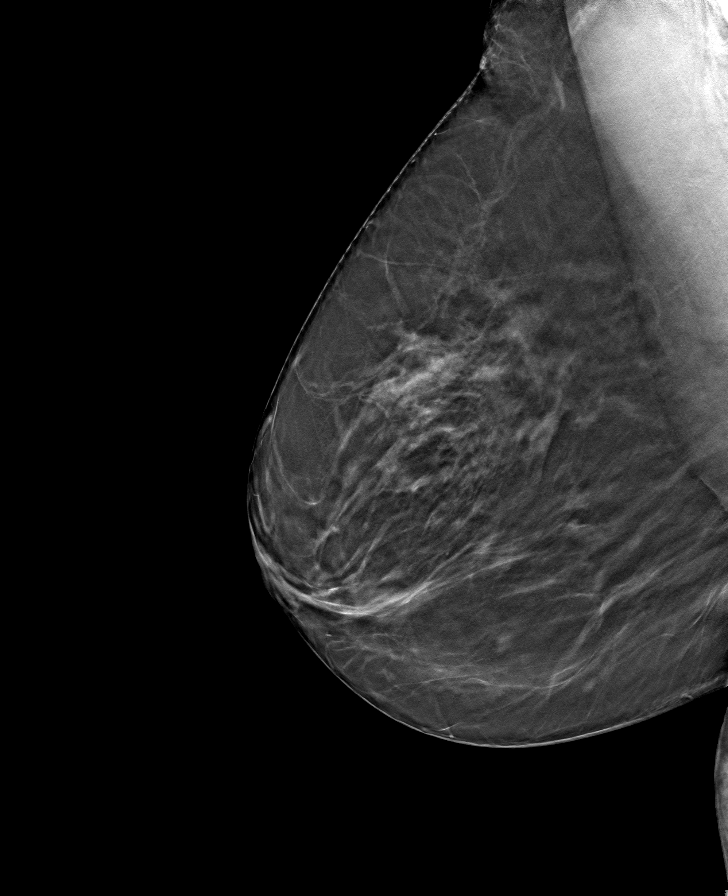

[L CC tomo · tomo slice 37/73.0]
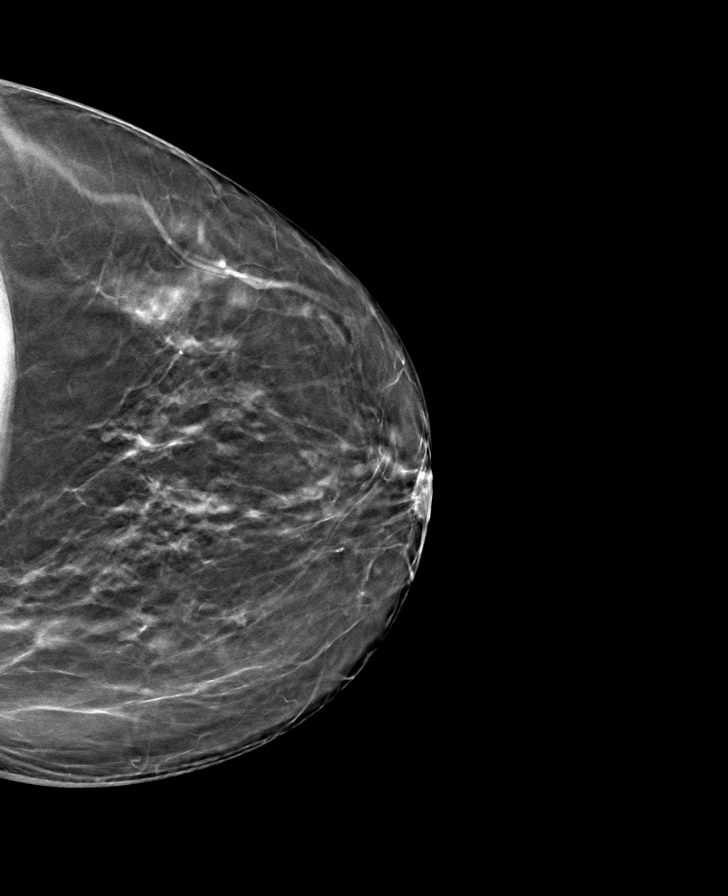

[R CC tomo · tomo slice 33/66.0]
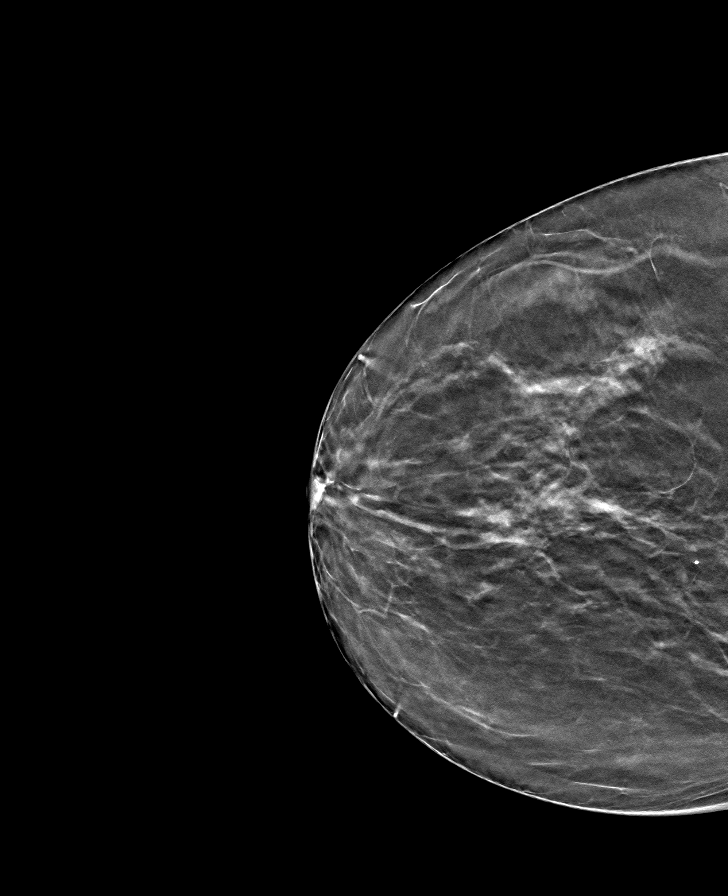

[L MLO tomo · tomo slice 37/74.0]
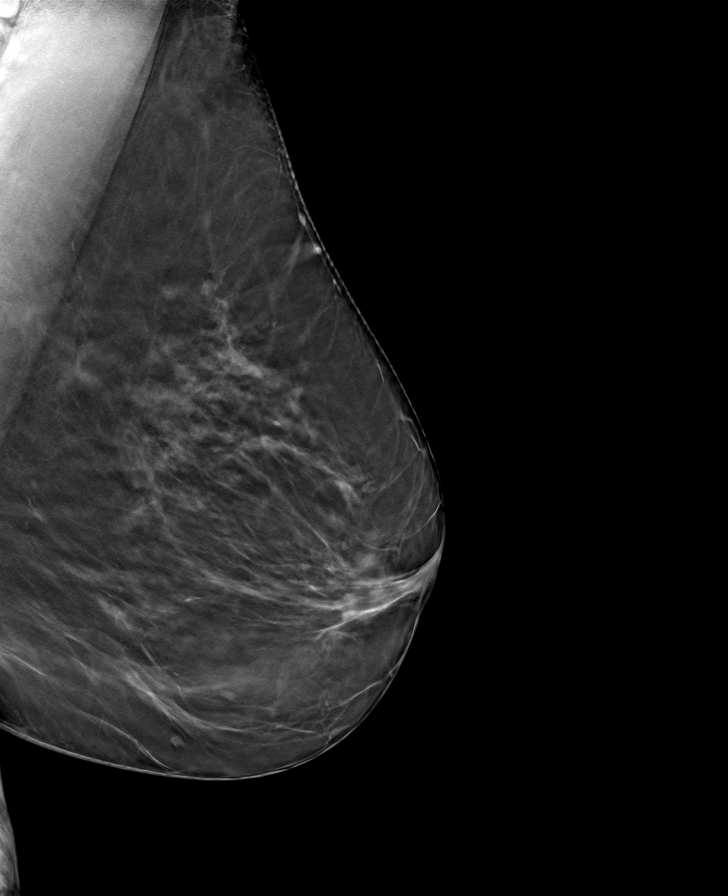

[8 of 24 positions shown; findings below may reference images not displayed]

ACR Breast Density Category b: There are scattered areas of
fibroglandular density.
FINDINGS: There are no findings suspicious for malignancy. The images were
evaluated with computer-aided detection.
IMPRESSION: No mammographic evidence of malignancy. A result letter of this
screening mammogram will be mailed directly to the patient.

RECOMMENDATION:
Screening mammogram in one year. (Code:WJ-I-BG6)

BI-RADS CATEGORY  1: Negative.

## 2021-07-22 LAB — SURGICAL PATHOLOGY

## 2021-07-24 ENCOUNTER — Other Ambulatory Visit: Payer: Self-pay | Admitting: Internal Medicine

## 2021-07-24 DIAGNOSIS — Z1231 Encounter for screening mammogram for malignant neoplasm of breast: Secondary | ICD-10-CM

## 2021-07-25 ENCOUNTER — Other Ambulatory Visit (HOSPITAL_COMMUNITY): Payer: Self-pay

## 2021-07-26 ENCOUNTER — Other Ambulatory Visit: Payer: Self-pay

## 2021-07-26 ENCOUNTER — Ambulatory Visit (INDEPENDENT_AMBULATORY_CARE_PROVIDER_SITE_OTHER): Payer: Medicare Other | Admitting: Plastic Surgery

## 2021-07-26 ENCOUNTER — Other Ambulatory Visit (HOSPITAL_COMMUNITY): Payer: Self-pay

## 2021-07-26 ENCOUNTER — Encounter: Payer: Self-pay | Admitting: Plastic Surgery

## 2021-07-26 DIAGNOSIS — L723 Sebaceous cyst: Secondary | ICD-10-CM

## 2021-07-26 NOTE — Progress Notes (Signed)
Patient is status post removal of right neck cystic lesion.  Physical exam Incision clean dry and intact  Pathology: Sebaceous cyst  Assessment and plan Sutures were removed and we will see the patient back as needed.  We discussed scar massage and sunscreen.

## 2021-08-06 ENCOUNTER — Other Ambulatory Visit (HOSPITAL_COMMUNITY): Payer: Self-pay

## 2021-08-15 DIAGNOSIS — I456 Pre-excitation syndrome: Secondary | ICD-10-CM | POA: Diagnosis not present

## 2021-08-15 DIAGNOSIS — E7800 Pure hypercholesterolemia, unspecified: Secondary | ICD-10-CM | POA: Diagnosis not present

## 2021-08-15 DIAGNOSIS — I340 Nonrheumatic mitral (valve) insufficiency: Secondary | ICD-10-CM | POA: Diagnosis not present

## 2021-08-23 ENCOUNTER — Encounter: Payer: Self-pay | Admitting: Internal Medicine

## 2021-09-04 ENCOUNTER — Other Ambulatory Visit (HOSPITAL_COMMUNITY)
Admission: RE | Admit: 2021-09-04 | Discharge: 2021-09-04 | Disposition: A | Discharge status: Home / Self Care | Payer: Medicare Other | Source: Ambulatory Visit | Attending: Obstetrics & Gynecology | Admitting: Obstetrics & Gynecology

## 2021-09-04 ENCOUNTER — Encounter: Payer: Self-pay | Admitting: Obstetrics & Gynecology

## 2021-09-04 ENCOUNTER — Other Ambulatory Visit: Payer: Self-pay

## 2021-09-04 ENCOUNTER — Ambulatory Visit (INDEPENDENT_AMBULATORY_CARE_PROVIDER_SITE_OTHER): Payer: Medicare Other | Admitting: Obstetrics & Gynecology

## 2021-09-04 VITALS — BP 127/71 | HR 84 | Ht 66.0 in | Wt 164.0 lb

## 2021-09-04 DIAGNOSIS — Z01419 Encounter for gynecological examination (general) (routine) without abnormal findings: Secondary | ICD-10-CM | POA: Diagnosis not present

## 2021-09-04 DIAGNOSIS — Z780 Asymptomatic menopausal state: Secondary | ICD-10-CM | POA: Diagnosis not present

## 2021-09-04 DIAGNOSIS — Z0000 Encounter for general adult medical examination without abnormal findings: Secondary | ICD-10-CM

## 2021-09-04 DIAGNOSIS — S3141XA Laceration without foreign body of vagina and vulva, initial encounter: Secondary | ICD-10-CM

## 2021-09-04 DIAGNOSIS — Z1151 Encounter for screening for human papillomavirus (HPV): Secondary | ICD-10-CM | POA: Diagnosis not present

## 2021-09-04 DIAGNOSIS — N950 Postmenopausal bleeding: Secondary | ICD-10-CM | POA: Diagnosis not present

## 2021-09-04 NOTE — Progress Notes (Signed)
Subjective:  ?  ? Alyssa Murray is a 71 y.o. female here for a routine exam.  Current complaints: pt reports that  she has been drinking lots of water in prep for a blood donation and wiping a lot and noted some blood with wiping x 1 yesterday. Her mother is deceased but had 'adenosarcoma of the endometrium carcinoma'. She is concerned. She has never had bleeding before. She denies pelvic pain or any other worrisome sx. She has not seem blood again since yesterday when she wiped.   ? ?Gynecologic History ?No LMP recorded. Patient is postmenopausal. ?Contraception: post menopausal status ?Last Pap: 08/15/2019. Results were: normal ?Last mammogram: 09/05/2020. Results were: normal ? ?Obstetric History ?OB History  ?Gravida Para Term Preterm AB Living  ?'3 2 2   1 2  '$ ?SAB IAB Ectopic Multiple Live Births  ?1       2  ?  ?# Outcome Date GA Lbr Len/2nd Weight Sex Delivery Anes PTL Lv  ?3 Term 12/02/88    M Vag-Spont   LIV  ?2 Term 02/15/85 [redacted]w[redacted]d  M Vag-Spont   LIV  ?1 SAB 06/23/72          ? ?The following portions of the patient's history were reviewed and updated as appropriate: allergies, current medications, past family history, past medical history, past social history, past surgical history, and problem list. ? ?Review of Systems ?Pertinent items are noted in HPI.  ?  ?Objective:  ?BP 127/71   Pulse 84   Ht '5\' 6"'$  (1.676 m)   Wt 164 lb (74.4 kg)   BMI 26.47 kg/m?  ? ?General Appearance:    Alert, cooperative, no distress, appears stated age  ?Head:    Normocephalic, without obvious abnormality, atraumatic  ?Eyes:    conjunctiva/corneas clear, EOM's intact, both eyes  ?Ears:    Normal external ear canals, both ears  ?Nose:   Nares normal, septum midline, mucosa normal, no drainage    or sinus tenderness  ?Throat:   Lips, mucosa, and tongue normal; teeth and gums normal  ?Neck:   Supple, symmetrical, trachea midline, no adenopathy;  ?  thyroid:  no enlargement/tenderness/nodules  ?Back:     Symmetric, no curvature,  ROM normal, no CVA tenderness  ?Lungs:     respirations unlabored  ?Chest Wall:    No tenderness or deformity  ? Heart:    Regular rate and rhythm  ?Breast Exam:    No tenderness, masses, or nipple abnormality  ?Abdomen:     Soft, non-tender, bowel sounds active all four quadrants,  ?  no masses, no organomegaly  ?Genitalia:    Normal female without lesion, discharge or tenderness  ? There is a healing excoriation next to the urethra on the left side. There is no other evidence of trauma. The vagina has no evidence of blood. The cervix is without lesions. The uterus is small and mobile and there are no adnexal masses.  ?Extremities:   Extremities normal, atraumatic, no cyanosis or edema  ?Pulses:   2+ and symmetric all extremities  ?Skin:   Skin color, texture, turgor normal, no rashes or lesions  ?  ? ?Assessment:  ? ? Healthy female exam.  ?Laceration vulvar- I suspect this as the cause of the bleeding with wiping x 1 episode. Given her FH and concern, I rec a TV [place] check the endometrial stripe. If it is thickened at all, I recommend an endo bx.  ? ?   ?Plan:  ?  Alyssa Murray was seen today for annual exam. ? ?Diagnoses and all orders for this visit: ? ?Well female exam with routine gynecological exam ? ?Laceration of vulva, initial encounter ?-     US PELVIS TRANSVAGINAL NON-OB (TV ONLY); Future ? ?Post-menopause ? ?Post-menopause bleeding ?-     US PELVIS TRANSVAGINAL NON-OB (TV ONLY); Future ? ?  ?F/u via [place] for results of [place] ?If all WNL and no further bleeding, rec f/u in 1 year.  ?Reviewed using a moist cloth to wpe unitl the laceration is fully healed.  ? ?Alyssa Murray, M.D., [place] ? ?

## 2021-09-06 ENCOUNTER — Ambulatory Visit
Admission: RE | Admit: 2021-09-06 | Discharge: 2021-09-06 | Disposition: A | Discharge status: Home / Self Care | Payer: Medicare Other | Source: Ambulatory Visit | Attending: Internal Medicine | Admitting: Internal Medicine

## 2021-09-06 DIAGNOSIS — Z1231 Encounter for screening mammogram for malignant neoplasm of breast: Secondary | ICD-10-CM | POA: Diagnosis not present

## 2021-09-09 ENCOUNTER — Other Ambulatory Visit (HOSPITAL_COMMUNITY): Payer: Self-pay

## 2021-09-09 LAB — CYTOLOGY - PAP
Comment: NEGATIVE
Diagnosis: NEGATIVE
High risk HPV: NEGATIVE

## 2021-09-12 ENCOUNTER — Ambulatory Visit (HOSPITAL_BASED_OUTPATIENT_CLINIC_OR_DEPARTMENT_OTHER)
Admission: RE | Admit: 2021-09-12 | Discharge: 2021-09-12 | Disposition: A | Discharge status: Home / Self Care | Payer: Medicare Other | Source: Ambulatory Visit | Attending: Obstetrics & Gynecology | Admitting: Obstetrics & Gynecology

## 2021-09-12 ENCOUNTER — Other Ambulatory Visit: Payer: Self-pay

## 2021-09-12 DIAGNOSIS — S3141XA Laceration without foreign body of vagina and vulva, initial encounter: Secondary | ICD-10-CM | POA: Diagnosis not present

## 2021-09-12 DIAGNOSIS — N950 Postmenopausal bleeding: Secondary | ICD-10-CM | POA: Diagnosis not present

## 2021-09-13 ENCOUNTER — Other Ambulatory Visit (HOSPITAL_COMMUNITY): Payer: Self-pay

## 2021-09-14 DIAGNOSIS — I456 Pre-excitation syndrome: Secondary | ICD-10-CM | POA: Diagnosis not present

## 2021-09-14 DIAGNOSIS — E7800 Pure hypercholesterolemia, unspecified: Secondary | ICD-10-CM | POA: Diagnosis not present

## 2021-09-18 ENCOUNTER — Encounter: Payer: Self-pay | Admitting: Internal Medicine

## 2021-10-10 ENCOUNTER — Other Ambulatory Visit (INDEPENDENT_AMBULATORY_CARE_PROVIDER_SITE_OTHER): Payer: Medicare Other

## 2021-10-10 DIAGNOSIS — E785 Hyperlipidemia, unspecified: Secondary | ICD-10-CM | POA: Diagnosis not present

## 2021-10-10 LAB — LIPID PANEL
Cholesterol: 117 mg/dL (ref 0–200)
HDL: 45.1 mg/dL (ref >39.00)
LDL Cholesterol: 59 mg/dL (ref 0–99)
NonHDL: 71.59
Total CHOL/HDL Ratio: 3
Triglycerides: 61 mg/dL (ref 0.0–149.0)
VLDL: 12.2 mg/dL (ref 0.0–40.0)

## 2021-10-10 LAB — ALT: ALT: 26 U/L (ref 0–35)

## 2021-10-10 LAB — AST: AST: 26 U/L (ref 0–37)

## 2021-10-11 ENCOUNTER — Other Ambulatory Visit (HOSPITAL_COMMUNITY): Payer: Self-pay

## 2021-10-11 MED ORDER — ROSUVASTATIN CALCIUM 5 MG PO TABS
5.0000 mg | ORAL_TABLET | Freq: Every day | ORAL | 3 refills | Status: DC
  Filled 2021-10-11: qty 90, 90d supply, fill #0
  Filled 2022-01-07: qty 90, 90d supply, fill #1

## 2021-10-11 NOTE — Addendum Note (Signed)
Addended byDamita Dunnings D on: 10/11/2021 03:07 PM ? ? Modules accepted: Orders ? ?

## 2021-10-21 ENCOUNTER — Other Ambulatory Visit (HOSPITAL_COMMUNITY): Payer: Self-pay

## 2021-10-24 ENCOUNTER — Ambulatory Visit (INDEPENDENT_AMBULATORY_CARE_PROVIDER_SITE_OTHER): Payer: Medicare Other

## 2021-10-24 DIAGNOSIS — Z780 Asymptomatic menopausal state: Secondary | ICD-10-CM | POA: Diagnosis not present

## 2021-10-24 DIAGNOSIS — Z0000 Encounter for general adult medical examination without abnormal findings: Secondary | ICD-10-CM | POA: Diagnosis not present

## 2021-10-24 NOTE — Patient Instructions (Signed)
Alyssa Murray , ?Thank you for taking time to come for your Medicare Wellness Visit. I appreciate your ongoing commitment to your health goals. Please review the following plan we discussed and let me know if I can assist you in the future.  ? ?Screening recommendations/referrals: ?Colonoscopy: 08/29/20 due 08/30/27 ?Mammogram: 09/06/21 due 09/07/22 ?Bone Density: ordered 10/24/21 ?Recommended yearly ophthalmology/optometry visit for glaucoma screening and checkup ?Recommended yearly dental visit for hygiene and checkup ? ?Vaccinations: ?Influenza vaccine: up to date ?Pneumococcal vaccine: up to date ?Tdap vaccine: up to date ?Shingles vaccine: up to date   ?Covid-19:completed ? ?Advanced directives: yes, on file ? ?Conditions/risks identified: see problem list  ? ?Next appointment: Follow up in one year for your annual wellness visit  ? ? ?Preventive Care 2 Years and Older, Female ?Preventive care refers to lifestyle choices and visits with your health care provider that can promote health and wellness. ?What does preventive care include? ?A yearly physical exam. This is also called an annual well check. ?Dental exams once or twice a year. ?Routine eye exams. Ask your health care provider how often you should have your eyes checked. ?Personal lifestyle choices, including: ?Daily care of your teeth and gums. ?Regular physical activity. ?Eating a healthy diet. ?Avoiding tobacco and drug use. ?Limiting alcohol use. ?Practicing safe sex. ?Taking low-dose aspirin every day. ?Taking vitamin and mineral supplements as recommended by your health care provider. ?What happens during an annual well check? ?The services and screenings done by your health care provider during your annual well check will depend on your age, overall health, lifestyle risk factors, and family history of disease. ?Counseling  ?Your health care provider may ask you questions about your: ?Alcohol use. ?Tobacco use. ?Drug use. ?Emotional well-being. ?Home and  relationship well-being. ?Sexual activity. ?Eating habits. ?History of falls. ?Memory and ability to understand (cognition). ?Work and work Statistician. ?Reproductive health. ?Screening  ?You may have the following tests or measurements: ?Height, weight, and BMI. ?Blood pressure. ?Lipid and cholesterol levels. These may be checked every 5 years, or more frequently if you are over 36 years old. ?Skin check. ?Lung cancer screening. You may have this screening every year starting at age 77 if you have a 30-pack-year history of smoking and currently smoke or have quit within the past 15 years. ?Fecal occult blood test (FOBT) of the stool. You may have this test every year starting at age 50. ?Flexible sigmoidoscopy or colonoscopy. You may have a sigmoidoscopy every 5 years or a colonoscopy every 10 years starting at age 21. ?Hepatitis C blood test. ?Hepatitis B blood test. ?Sexually transmitted disease (STD) testing. ?Diabetes screening. This is done by checking your blood sugar (glucose) after you have not eaten for a while (fasting). You may have this done every 1-3 years. ?Bone density scan. This is done to screen for osteoporosis. You may have this done starting at age 20. ?Mammogram. This may be done every 1-2 years. Talk to your health care provider about how often you should have regular mammograms. ?Talk with your health care provider about your test results, treatment options, and if necessary, the need for more tests. ?Vaccines  ?Your health care provider may recommend certain vaccines, such as: ?Influenza vaccine. This is recommended every year. ?Tetanus, diphtheria, and acellular pertussis (Tdap, Td) vaccine. You may need a Td booster every 10 years. ?Zoster vaccine. You may need this after age 20. ?Pneumococcal 13-valent conjugate (PCV13) vaccine. One dose is recommended after age 75. ?Pneumococcal polysaccharide (PPSV23) vaccine. One  dose is recommended after age 62. ?Talk to your health care provider  about which screenings and vaccines you need and how often you need them. ?This information is not intended to replace advice given to you by your health care provider. Make sure you discuss any questions you have with your health care provider. ?Document Released: 07/06/2015 Document Revised: 02/27/2016 Document Reviewed: 04/10/2015 ?Elsevier Interactive Patient Education ? 2017 [place]. ? ?Fall Prevention in the Home ?Falls can cause injuries. They can happen to people of all ages. There are many things you can do to make your home safe and to help prevent falls. ?What can I do on the outside of my home? ?Regularly fix the edges of walkways and driveways and fix any cracks. ?Remove anything that might make you trip as you walk through a door, such as a raised step or threshold. ?Trim any bushes or trees on the path to your home. ?Use bright outdoor lighting. ?Clear any walking paths of anything that might make someone trip, such as rocks or tools. ?Regularly check to see if handrails are loose or broken. Make sure that both sides of any steps have handrails. ?Any raised decks and porches should have guardrails on the edges. ?Have any leaves, snow, or ice cleared regularly. ?Use sand or salt on walking paths during winter. ?Clean up any spills in your garage right away. This includes oil or grease spills. ?What can I do in the bathroom? ?Use night lights. ?Install grab bars by the toilet and in the tub and shower. Do not use towel bars as grab bars. ?Use non-skid mats or decals in the tub or shower. ?If you need to sit down in the shower, use a plastic, non-slip stool. ?Keep the floor dry. Clean up any water that spills on the floor as soon as it happens. ?Remove soap buildup in the tub or shower regularly. ?Attach bath mats securely with double-sided non-slip rug tape. ?Do not have throw rugs and other things on the floor that can make you trip. ?What can I do in the bedroom? ?Use night lights. ?Make sure  that you have a light by your bed that is easy to reach. ?Do not use any sheets or blankets that are too big for your bed. They should not hang down onto the floor. ?Have a firm chair that has side arms. You can use this for support while you get dressed. ?Do not have throw rugs and other things on the floor that can make you trip. ?What can I do in the kitchen? ?Clean up any spills right away. ?Avoid walking on wet floors. ?Keep items that you use a lot in easy-to-reach places. ?If you need to reach something above you, use a strong step stool that has a grab bar. ?Keep electrical cords out of the way. ?Do not use floor polish or wax that makes floors slippery. If you must use wax, use non-skid floor wax. ?Do not have throw rugs and other things on the floor that can make you trip. ?What can I do with my stairs? ?Do not leave any items on the stairs. ?Make sure that there are handrails on both sides of the stairs and use them. Fix handrails that are broken or loose. Make sure that handrails are as long as the stairways. ?Check any carpeting to make sure that it is firmly attached to the stairs. Fix any carpet that is loose or worn. ?Avoid having throw rugs at the top or bottom of the  stairs. If you do have throw rugs, attach them to the floor with carpet tape. ?Make sure that you have a light switch at the top of the stairs and the bottom of the stairs. If you do not have them, ask someone to add them for you. ?What else can I do to help prevent falls? ?Wear shoes that: ?Do not have high heels. ?Have rubber bottoms. ?Are comfortable and fit you well. ?Are closed at the toe. Do not wear sandals. ?If you use a stepladder: ?Make sure that it is fully opened. Do not climb a closed stepladder. ?Make sure that both sides of the stepladder are locked into place. ?Ask someone to hold it for you, if possible. ?Clearly mark and make sure that you can see: ?Any grab bars or handrails. ?First and last steps. ?Where the edge of  each step is. ?Use tools that help you move around (mobility aids) if they are needed. These include: ?Canes. ?Walkers. ?Scooters. ?Crutches. ?Turn on the lights when you go into a dark area. Replace any lig

## 2021-10-24 NOTE — Progress Notes (Cosign Needed Addendum)
Subjective:   Alyssa Murray is a 71 y.o. female who presents for Medicare Annual (Subsequent) preventive examination.  I connected with  [name] on 10/24/21 by a audio enabled telemedicine application and verified that I am speaking with the correct person using two identifiers.  Patient Location: Home  Provider Location: Office/Clinic  I discussed the limitations of evaluation and management by telemedicine. The patient expressed understanding and agreed to proceed.   Review of Systems     Cardiac Risk Factors include: advanced age (>36mn, >>33women);dyslipidemia     Objective:    Today's Vitals   10/24/21 1306  PainSc: 3    There is no height or weight on file to calculate BMI.     10/24/2021    1:10 PM 02/22/2020   11:54 AM 02/17/2019   10:02 AM 02/05/2018    3:55 PM  Advanced Directives  Does Patient Have a Medical Advance Directive? Yes Yes Yes No  Type of AParamedicof [place]Living will;Out of facility DNR (pink MOST or yellow form) [place]Living will [place] will   Does patient want to make changes to medical advance directive? No - Patient declined No - Patient declined No - Patient declined   Copy of [place] Chart? Yes - validated most recent copy scanned in chart (See row information) Yes - validated most recent copy scanned in chart (See row information) No - copy requested   Would patient like information on creating a medical advance directive?    Yes (MAU/Ambulatory/Procedural Areas - Information given)    Current Medications (verified) Outpatient Encounter Medications as of 10/24/2021  Medication Sig   Calcium Carbonate-Vitamin D (CALCIUM 600+D HIGH POTENCY PO)    fluticasone (FLONASE) 50 MCG/ACT nasal spray Place 2 sprays into both nostrils daily.   Magnesium 250 MG TABS Take 1 tablet by mouth daily.   Multiple Vitamins-Minerals (ONE-A-DAY WOMENS 50+  ADVANTAGE) TABS    Olopatadine HCl 0.6 % SOLN Place 2 sprays into the nose 2 (two) times daily.   rosuvastatin (CRESTOR) 5 MG tablet Take 1 tablet (5 mg total) by mouth daily.   Turmeric Curcumin 500 MG CAPS    No facility-administered encounter medications on file as of 10/24/2021.    Allergies (verified) Dust mite extract and Tree extract   History: Past Medical History:  Diagnosis Date   Allergic rhinitis    Allergy    Diabetes mellitus without complication (HCC)    GERD (gastroesophageal reflux disease)    HSV infection    L buttocks   Hx of adenomatous colonic polyps 09/11/2020   2 diminutive adenomas repeat 7 years approximately 2029   HYPERGLYCEMIA, BORDERLINE 01/23/2010   Menopause 2005   Osteopenia    Palpitation    cardiac ablation secondary to palptiation done aprox 2000- now Asx   Past Surgical History:  Procedure Laterality Date   bilateral bunionectomy  2002   CARDIAC ELECTROPHYSIOLOGY MAPPING AND ABLATION  around 2000       COLONOSCOPY W/ POLYPECTOMY  March 202203/2022   ESOPHAGEAL MANOMETRY N/A 09/24/2020   Procedure: ESOPHAGEAL MANOMETRY (EM);  Surgeon: GGatha Mayer MD;  Location: [facility];  Service: Endoscopy;  Laterality: N/A;   ESOPHAGOGASTRODUODENOSCOPY  08/2020   FOOT SURGERY Left 03/2013   bunion/hammer toe   MOUTH SURGERY  03-2012   implants   [place]EDANCE STUDY N/A 09/24/2020   Procedure: PHaskinsIMPEDANCE STUDY;  Surgeon: GGatha Mayer  MD;  Location: [facility];  Service: Endoscopy;  Laterality: N/A;   Family History  Problem Relation Age of Onset   Hypertension Mother    Lung cancer Mother    Endometrial cancer Mother        question off   Diabetes Other        G parents    Throat cancer Brother    Cancer Brother    Esophageal cancer Brother    Coronary artery disease Neg Hx    Stroke Neg Hx    Colon cancer Neg Hx    Breast cancer Neg Hx    Ovarian cancer Neg Hx    Stomach cancer Neg Hx    Rectal cancer Neg Hx    Social  History   Socioeconomic History   Marital status: Divorced    Spouse name: Not on file   Number of children: 2   Years of education: Not on file   Highest education level: Not on file  Occupational History   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   Occupation: Copywriter, 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[employer]: [place]  Tobacco Use   Smoking status: Never   Smokeless tobacco: Never  Vaping Use   Vaping Use: Never used  Substance and Sexual Activity   Alcohol use: Yes    Comment: occasional/ Social   Drug use: No   Sexual activity: Yes  Other Topics Concern   Not on file  Social History Narrative   2 sons, they are in [place] the other in [place]-- pt    She was present at the [employer] and has disabilities related to that   Rare to occasional alcohol never smoker no drug use   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            Social Determinants of Radio broadcast assistant Strain: Low Risk    Difficulty of Paying Living Expenses: Not hard at all  Food Insecurity: No Food Insecurity   Worried About Charity fundraiser in the Last Year: Never true   Arboriculturist in the Last Year: Never true  Transportation Needs: No Transportation Needs   Lack of Transportation (Medical): No   Lack of Transportation (Non-Medical): No  Physical Activity: Sufficiently Active   Days of Exercise per Week: 7 days   Minutes of Exercise per Session: 30 min  Stress: No Stress Concern Present   Feeling of Stress : Not at all  Social Connections: Moderately Integrated   Frequency of Communication with Friends and Family: More than three times a week   Frequency of Social Gatherings with Friends and Family: Never   Attends Religious Services: More than 4 times per year   Active Member of [employer] or Organizations: Yes   Attends Music therapist: More than 4 times per year   Marital Status: Divorced    Tobacco  Counseling Counseling given: Not Answered   Clinical Intake:  Pre-visit preparation completed: Yes  Pain : 0-10 Pain Score: 3  Pain Type: Chronic pain Pain Location: Back Pain Orientation: Lower, Right Pain Descriptors / Indicators: Aching, Dull, Sore Pain Frequency: Occasional     Nutritional Risks: None Diabetes: No  How often do you need to have someone help you when you read instructions, pamphlets, or other written materials from your doctor or pharmacy?: 1 - Never  Diabetic?No  Interpreter Needed?: No  Information  entered by :: Shikha Bibb   Activities of Daily Living    10/24/2021    1:08 PM 04/03/2021    2:41 PM  In your present state of health, do you have any difficulty performing the following activities:  Hearing? 0 0  Vision? 0 0  Difficulty concentrating or making decisions? 0 0  Walking or climbing stairs? 0 0  Dressing or bathing? 0 0  Doing errands, shopping? 0 0  Preparing Food and eating ? N   Using the Toilet? N   In the past six months, have you accidently leaked urine? N   Do you have problems with loss of bowel control? N   Managing your Medications? N   Managing your Finances? N   Housekeeping or managing your Housekeeping? N     Patient Care Team: Colon Branch, MD as PCP - General Carlean Purl Ofilia Neas, MD as Consulting Physician (Gastroenterology)  Indicate any recent Medical Services you may have received from other than [facility] providers in the past year (date may be approximate).     Assessment:   This is a routine wellness examination for K[place].  Hearing/Vision screen No results found.  Dietary issues and exercise activities discussed: Current Exercise Habits: Home exercise routine, Type of exercise: walking, Time (Minutes): 30, Frequency (Times/Week): 7, Weekly Exercise (Minutes/Week): 210, Intensity: Mild   Goals Addressed   None    Depression Screen    10/24/2021    1:08 PM 10/24/2021    1:06 PM 06/19/2021    8:57 AM  12/18/2020    1:43 PM 06/19/2020    9:21 AM 02/22/2020   11:57 AM 02/17/2019   10:03 AM  PHQ 2/9 Scores  PHQ - 2 Score 0 0 0 0 0 0 0    Fall Risk    10/24/2021    1:06 PM 06/19/2021    8:57 AM 12/18/2020    1:43 PM 06/19/2020    9:21 AM 02/22/2020   11:57 AM  [place] in the past year? 0 0 0 0 0  Number falls in past yr: 0 0 0 0 0  Injury with Fall? 0 0 0 0 0  Risk for fall due to : No Fall Risks      Follow up Falls evaluation completed Falls evaluation completed Falls evaluation completed  Education provided;Falls prevention discussed    FALL RISK PREVENTION PERTAINING TO THE HOME:  Any stairs in or around the home? Yes  If so, are there any without handrails? No  Home free of loose throw rugs in walkways, pet beds, electrical cords, etc? Yes  Adequate lighting in your home to reduce risk of falls? Yes   ASSISTIVE DEVICES UTILIZED TO PREVENT FALLS:  Life alert? No  Use of a cane, walker or w/c? No  Grab bars in the bathroom? No  Shower chair or bench in shower? No  Elevated toilet seat or a handicapped toilet? Yes   TIMED UP AND GO:  Was the test performed? No .    Cognitive Function:        10/24/2021    1:11 PM  6CIT Screen  What Year? 0 points  What month? 0 points  What time? 0 points  Count back from 20 0 points  Months in reverse 0 points  Repeat phrase 0 points  Total Score 0 points    Immunizations Immunization History  Administered Date(s) Administered   Fluad Quad(high Dose 65+) 02/24/2019, 03/28/2020, 04/03/2021   Influenza  Split 04/20/2012   Influenza, High Dose Seasonal PF 03/11/2016, 04/10/2017, 05/31/2018   Influenza,inj,Quad PF,6+ Mos 03/08/2014, 05/22/2015   PFIZER(Purple Top)SARS-COV-2 Vaccination 07/30/2019, 08/20/2019, 04/10/2020   Pfizer Covid-19 Vaccine Bivalent Booster 98yr & up 04/03/2021   Pneumococcal Conjugate-13 03/11/2016   Pneumococcal Polysaccharide-23 04/10/2017   Td 11/26/2006   Tdap 01/20/2017   Zoster  Recombinat (Shingrix) 07/01/2017, 09/25/2017   Zoster, Live 11/24/2011    TDAP status: Up to date  Flu Vaccine status: Up to date  Pneumococcal vaccine status: Up to date  Covid-19 vaccine status: Completed vaccines  Qualifies for Shingles Vaccine? Yes   Zostavax completed No   Shingrix Completed?: Yes  Screening Tests Health Maintenance  Topic Date Due   FOOT EXAM  Never done   OPHTHALMOLOGY EXAM  Never done   HEMOGLOBIN A1C  12/18/2021   INFLUENZA VACCINE  01/21/2022   URINE MICROALBUMIN  06/19/2022   MAMMOGRAM  09/07/2022   TETANUS/TDAP  01/21/2027   COLONOSCOPY (Pts 45-415yrInsurance coverage will need to be confirmed)  08/30/2027   Pneumonia Vaccine 6557Years old  Completed   DEXA SCAN  Completed   COVID-19 Vaccine  Completed   Hepatitis C Screening  Completed   Zoster Vaccines- Shingrix  Completed   HPV VACCINES  Aged Out    Health Maintenance  Health Maintenance Due  Topic Date Due   FOOT EXAM  Never done   OPHTHALMOLOGY EXAM  Never done    Colorectal cancer screening: Type of screening: Colonoscopy. Completed 08/29/20. Repeat every 7 years  Mammogram status: Completed 08/1721. Repeat every year  Bone Density status: Ordered 10/24/21. Pt provided with contact info and advised to call to schedule appt.  Lung Cancer Screening: (Low Dose CT Chest recommended if Age 71-80ears, 30 pack-year currently smoking OR have quit w/in 15years.) does not qualify.   Lung Cancer Screening Referral: N/A  Additional Screening:  Hepatitis C Screening: does qualify; Completed 05/22/15  Vision Screening: Recommended annual ophthalmology exams for early detection of glaucoma and other disorders of the eye. Is the patient up to date with their annual eye exam?  Yes  Who is the provider or what is the name of the office in which the patient attends annual eye exams? [facility] If pt is not established with a provider, would they like to be referred to a provider to establish  care? No .   Dental Screening: Recommended annual dental exams for proper oral hygiene  Community Resource Referral / Chronic Care Management: CRR required this visit?  No   CCM required this visit?  No      Plan:     I have personally reviewed and noted the following in the patient's chart:   Medical and social history Use of alcohol, tobacco or illicit drugs  Current medications and supplements including opioid prescriptions.  Functional ability and status Nutritional status Physical activity Advanced directives List of other physicians Hospitalizations, surgeries, and ER visits in previous 12 months Vitals Screenings to include cognitive, depression, and falls Referrals and appointments  In addition, I have reviewed and discussed with patient certain preventive protocols, quality metrics, and best practice recommendations. A written personalized care plan for preventive services as well as general preventive health recommendations were provided to patient.   Due to this being a telephonic visit, the after visit summary with patients personalized plan was offered to patient via mail or my-chart. Patient would like to access on my-chart.    Stephani Janak A Liyah Higham, CMA  10/24/2021   Nurse Notes: dexa placed  I have reviewed and agree with [facility] documentation.  Kathlene November, MD  I have reviewed and agree with [facility] documentation.  Kathlene November, MD

## 2021-10-30 ENCOUNTER — Encounter: Payer: Self-pay | Admitting: Internal Medicine

## 2021-10-30 DIAGNOSIS — I456 Pre-excitation syndrome: Secondary | ICD-10-CM | POA: Diagnosis not present

## 2021-10-30 DIAGNOSIS — R0600 Dyspnea, unspecified: Secondary | ICD-10-CM | POA: Diagnosis not present

## 2021-10-30 DIAGNOSIS — R002 Palpitations: Secondary | ICD-10-CM | POA: Diagnosis not present

## 2021-10-30 DIAGNOSIS — E7800 Pure hypercholesterolemia, unspecified: Secondary | ICD-10-CM | POA: Diagnosis not present

## 2021-11-15 DIAGNOSIS — H2513 Age-related nuclear cataract, bilateral: Secondary | ICD-10-CM | POA: Diagnosis not present

## 2021-11-15 DIAGNOSIS — E119 Type 2 diabetes mellitus without complications: Secondary | ICD-10-CM | POA: Diagnosis not present

## 2021-11-15 DIAGNOSIS — H43813 Vitreous degeneration, bilateral: Secondary | ICD-10-CM | POA: Diagnosis not present

## 2021-11-15 LAB — HM DIABETES EYE EXAM

## 2021-11-19 ENCOUNTER — Encounter: Payer: Self-pay | Admitting: Internal Medicine

## 2021-11-21 ENCOUNTER — Ambulatory Visit (HOSPITAL_BASED_OUTPATIENT_CLINIC_OR_DEPARTMENT_OTHER)
Admission: RE | Admit: 2021-11-21 | Discharge: 2021-11-21 | Disposition: A | Discharge status: Home / Self Care | Payer: Medicare Other | Source: Ambulatory Visit | Attending: Internal Medicine | Admitting: Internal Medicine

## 2021-11-21 DIAGNOSIS — M8589 Other specified disorders of bone density and structure, multiple sites: Secondary | ICD-10-CM | POA: Diagnosis not present

## 2021-11-21 DIAGNOSIS — Z780 Asymptomatic menopausal state: Secondary | ICD-10-CM | POA: Diagnosis not present

## 2021-12-27 ENCOUNTER — Encounter: Payer: Self-pay | Admitting: Internal Medicine

## 2021-12-30 ENCOUNTER — Other Ambulatory Visit (HOSPITAL_COMMUNITY): Payer: Self-pay

## 2022-01-02 ENCOUNTER — Encounter: Payer: Self-pay | Admitting: Internal Medicine

## 2022-01-02 ENCOUNTER — Other Ambulatory Visit (HOSPITAL_COMMUNITY): Payer: Self-pay

## 2022-01-02 ENCOUNTER — Ambulatory Visit (HOSPITAL_BASED_OUTPATIENT_CLINIC_OR_DEPARTMENT_OTHER)
Admission: RE | Admit: 2022-01-02 | Discharge: 2022-01-02 | Disposition: A | Discharge status: Home / Self Care | Payer: Medicare Other | Source: Ambulatory Visit | Attending: Internal Medicine | Admitting: Internal Medicine

## 2022-01-02 ENCOUNTER — Ambulatory Visit (INDEPENDENT_AMBULATORY_CARE_PROVIDER_SITE_OTHER): Payer: Medicare Other | Admitting: Internal Medicine

## 2022-01-02 VITALS — BP 110/78 | HR 89 | Temp 98.0°F | Resp 18 | Ht 66.0 in | Wt 156.6 lb

## 2022-01-02 DIAGNOSIS — J320 Chronic maxillary sinusitis: Secondary | ICD-10-CM | POA: Diagnosis not present

## 2022-01-02 DIAGNOSIS — M542 Cervicalgia: Secondary | ICD-10-CM | POA: Diagnosis not present

## 2022-01-02 MED ORDER — METHOCARBAMOL 500 MG PO TABS
500.0000 mg | ORAL_TABLET | Freq: Three times a day (TID) | ORAL | 0 refills | Status: DC | PRN
  Filled 2022-01-02: qty 30, 10d supply, fill #0

## 2022-01-02 NOTE — Progress Notes (Signed)
Subjective:    Patient ID: Alyssa Murray, female    DOB: [birth year], 71 y.o.   MRN: [id number]  DOS:  01/02/2022 Type of visit - description: Acute  Several issues. For the last 3 to 4 months he is having pain at the base of the right neck.  It is on and off, worse when she turns the head to the left, at times is very intense. Biofreeze and a hot shower typically help. Denies any radiation to the arm but the does radiate upwards. Denies any arm paresthesias. No history of injury or previous surgery there.  Food allergies?  For the last month, has noted on and off a weird sensation all over, like "crawling under the skin".  No actual rash, no lip swelling, no wheezing  Continue with R sinus congestion.   Review of Systems See above   Past Medical History:  Diagnosis Date   Allergic rhinitis    Allergy    Diabetes mellitus without complication (HCC)    GERD (gastroesophageal reflux disease)    HSV infection    L buttocks   Hx of adenomatous colonic polyps 09/11/2020   2 diminutive adenomas repeat 7 years approximately 2029   HYPERGLYCEMIA, BORDERLINE 01/23/2010   Menopause 2005   Osteopenia    Palpitation    cardiac ablation secondary to palptiation done aprox 2000- now Asx    Past Surgical History:  Procedure Laterality Date   bilateral bunionectomy  2002   CARDIAC ELECTROPHYSIOLOGY MAPPING AND ABLATION  around 2000       COLONOSCOPY W/ POLYPECTOMY  March 202203/2022   ESOPHAGEAL MANOMETRY N/A 09/24/2020   Procedure: ESOPHAGEAL MANOMETRY (EM);  Surgeon: Gatha Mayer, MD;  Location: [facility];  Service: Endoscopy;  Laterality: N/A;   ESOPHAGOGASTRODUODENOSCOPY  08/2020   FOOT SURGERY Left 03/2013   bunion/hammer toe   MOUTH SURGERY  03-2012   implants   PH IMPEDANCE STUDY N/A 09/24/2020   Procedure: [place] IMPEDANCE STUDY;  Surgeon: Gatha Mayer, MD;  Location: [facility];  Service: Endoscopy;  Laterality: N/A;    Current Outpatient Medications  Medication  Instructions   Calcium Carbonate-Vitamin D (CALCIUM 600+D HIGH POTENCY PO) No dose, route, or frequency recorded.   fluticasone (FLONASE) 50 MCG/ACT nasal spray 2 sprays, Each Nare, Daily   Magnesium 250 MG TABS 1 tablet, Oral, Daily   Multiple Vitamins-Minerals (ONE-A-DAY WOMENS 50+ ADVANTAGE) TABS No dose, route, or frequency recorded.   Olopatadine HCl 0.6 % SOLN 2 sprays, Nasal, 2 times daily   rosuvastatin (CRESTOR) 5 mg, Oral, Daily   Turmeric Curcumin 500 MG CAPS No dose, route, or frequency recorded.       Objective:   Physical Exam BP 110/78 (BP Location: Right Arm, Patient Position: Sitting, Cuff Size: Normal)   Pulse 89   Temp 98 F (36.7 C) (Oral)   Resp 18   Ht '5\' 6"'$  (1.676 m)   Wt 156 lb 9.6 oz (71 kg)   SpO2 95%   BMI 25.28 kg/m  General:   Well developed, NAD, BMI noted. HEENT:  Normocephalic . Face symmetric, atraumatic Nose slightly congested, sinuses non-TTP Neck: No TTP of the cervical spine. ROM slightly limited by pain when she tries to turn the head to the left or with neck  flexion. Lower extremities: no pretibial edema bilaterally  Skin: Not pale. Not jaundice Neurologic:  alert & oriented X3.  Speech normal, gait appropriate for age and unassisted Motor symmetric.  DTR symmetric. Psych--  Cognition  and judgment appear intact.  Cooperative with normal attention span and concentration.  Behavior appropriate. No anxious or depressed appearing.      Assessment     Assessment DM   (A1c 6.25 November 2020) Hyperlipidemia: rx lipitor 04-2016, good response, d/c 5/2-18 d/t elevated LFTs Cardiac ablation due to palpitations ~ 2000: now asx Mild anemia 2015, normal iron Osteopenia -- T score -1.5  (2014), T score -1.7 (01/2018), nl vit D 2017 Rash (prn  lotrisone, groin) HSV infection, left buttock Cough, chronic (was exposed to dust from 9/11 attack, f/u by a [facility] organization) [place] attack: Was first responder, they certified GERD and  chronic rhinosinusitis are related to this incident    PLAN:  Here with multiple issues Neck pain: As described above, at times intense.  No radiculopathy type of symptoms. Plan: Refer to Ortho, x-ray, treatment with warm compresses and Robaxin.  Watch for drowsiness. Food allergies?  She really needs to see an allergist, she is leaving to [place] in September so recommend to see an allergist in [place] when she can.  In the meantime take Allegra 60 mg twice daily.  If she has severe symptoms she needs to let us know. R sinus congestion: Using both steroids and antihistamine nasal sprays.  Given this is on a single side, recommend to see ENT at her convenience noting that she had a MRI of the brain few months ago and incidentally they noted a small right mastoid effusion. Dyslipidemia:  has seen cardiology, on rosuvastatin

## 2022-01-02 NOTE — Patient Instructions (Addendum)
Go to the first floor and get the x-ray of your neck.  We are referring you to a orthopedic doctor regards your neck pain.  For pain: Use a heating pad or warm compress at the neck as needed Start Robaxin, a muscle relaxant up to 3 times a day.  Watch for excessive drowsiness Tylenol  500 mg OTC 2 tabs a day every 8 hours as needed for pain  For sinus congestion: Continue with the 2 nasal sprays. See ENT in [place].  For food allergies: Start Allegra 60 mg 1 tablet twice daily.  You may get it over-the-counter. Please arrange a visit with an allergist for further testing when you are in [place].

## 2022-01-03 NOTE — Assessment & Plan Note (Signed)
Here with multiple issues Neck pain: As described above, at times intense.  No radiculopathy type of symptoms. Plan: Refer to Ortho, x-ray, treatment with warm compresses and Robaxin.  Watch for drowsiness. Food allergies?  She really needs to see an allergist, she is leaving to [place] in September so recommend to see an allergist in [place] when she can.  In the meantime take Allegra 60 mg twice daily.  If she has severe symptoms she needs to let us know. R sinus congestion: Using both steroids and antihistamine nasal sprays.  Given this is on a single side, recommend to see ENT at her convenience noting that she had a MRI of the brain few months ago and incidentally they noted a small right mastoid effusion. Dyslipidemia:  has seen cardiology, on rosuvastatin

## 2022-01-07 ENCOUNTER — Other Ambulatory Visit (HOSPITAL_COMMUNITY): Payer: Self-pay

## 2022-01-13 ENCOUNTER — Other Ambulatory Visit (HOSPITAL_COMMUNITY): Payer: Self-pay

## 2022-01-13 DIAGNOSIS — M542 Cervicalgia: Secondary | ICD-10-CM | POA: Diagnosis not present

## 2022-01-13 MED ORDER — PREDNISONE 10 MG (21) PO TBPK
ORAL_TABLET | ORAL | 0 refills | Status: AC
  Filled 2022-01-13: qty 21, 6d supply, fill #0

## 2022-01-13 MED ORDER — MELOXICAM 15 MG PO TABS
15.0000 mg | ORAL_TABLET | Freq: Every day | ORAL | 0 refills | Status: AC
  Filled 2022-01-13: qty 30, 30d supply, fill #0

## 2022-01-13 MED ORDER — METHOCARBAMOL 500 MG PO TABS
500.0000 mg | ORAL_TABLET | Freq: Three times a day (TID) | ORAL | 0 refills | Status: DC
  Filled 2022-01-13: qty 30, 5d supply, fill #0

## 2022-01-16 DIAGNOSIS — R519 Headache, unspecified: Secondary | ICD-10-CM | POA: Diagnosis not present

## 2022-01-17 ENCOUNTER — Other Ambulatory Visit (HOSPITAL_COMMUNITY): Payer: Self-pay

## 2022-01-21 DIAGNOSIS — R519 Headache, unspecified: Secondary | ICD-10-CM | POA: Diagnosis not present

## 2022-01-23 DIAGNOSIS — R519 Headache, unspecified: Secondary | ICD-10-CM | POA: Diagnosis not present

## 2022-01-28 DIAGNOSIS — R519 Headache, unspecified: Secondary | ICD-10-CM | POA: Diagnosis not present

## 2022-01-30 DIAGNOSIS — R519 Headache, unspecified: Secondary | ICD-10-CM | POA: Diagnosis not present

## 2022-02-11 DIAGNOSIS — I369 Nonrheumatic tricuspid valve disorder, unspecified: Secondary | ICD-10-CM | POA: Diagnosis not present

## 2022-02-11 DIAGNOSIS — R079 Chest pain, unspecified: Secondary | ICD-10-CM | POA: Diagnosis not present

## 2022-02-11 DIAGNOSIS — I456 Pre-excitation syndrome: Secondary | ICD-10-CM | POA: Diagnosis not present

## 2022-02-11 DIAGNOSIS — I349 Nonrheumatic mitral valve disorder, unspecified: Secondary | ICD-10-CM | POA: Diagnosis not present

## 2022-02-18 DIAGNOSIS — R519 Headache, unspecified: Secondary | ICD-10-CM | POA: Diagnosis not present

## 2022-02-20 DIAGNOSIS — R519 Headache, unspecified: Secondary | ICD-10-CM | POA: Diagnosis not present

## 2022-02-25 DIAGNOSIS — R519 Headache, unspecified: Secondary | ICD-10-CM | POA: Diagnosis not present

## 2022-02-27 DIAGNOSIS — R519 Headache, unspecified: Secondary | ICD-10-CM | POA: Diagnosis not present

## 2022-03-05 ENCOUNTER — Other Ambulatory Visit (HOSPITAL_COMMUNITY): Payer: Self-pay

## 2022-03-05 ENCOUNTER — Other Ambulatory Visit: Payer: Self-pay | Admitting: Internal Medicine

## 2022-03-05 MED ORDER — OLOPATADINE HCL 0.6 % NA SOLN
2.0000 | Freq: Two times a day (BID) | NASAL | 6 refills | Status: DC
  Filled 2022-03-05: qty 30.5, 30d supply, fill #0

## 2022-03-18 DIAGNOSIS — Z23 Encounter for immunization: Secondary | ICD-10-CM | POA: Diagnosis not present

## 2022-04-11 ENCOUNTER — Encounter: Payer: Self-pay | Admitting: Internal Medicine

## 2022-04-11 MED ORDER — OLOPATADINE HCL 0.6 % NA SOLN: 2.0000 | Freq: Two times a day (BID) | NASAL | 1 refills | Status: AC

## 2022-04-11 MED ORDER — ROSUVASTATIN CALCIUM 5 MG PO TABS: 5.0000 mg | ORAL_TABLET | Freq: Every day | ORAL | 1 refills | Status: AC

## 2022-04-11 MED ORDER — METHOCARBAMOL 500 MG PO TABS: 500.0000 mg | ORAL_TABLET | Freq: Three times a day (TID) | ORAL | 0 refills | Status: AC | PRN

## 2022-05-23 ENCOUNTER — Encounter: Payer: Self-pay | Admitting: Internal Medicine

## 2022-06-20 ENCOUNTER — Encounter: Payer: Medicare Other | Admitting: Internal Medicine

## 2022-07-01 ENCOUNTER — Ambulatory Visit: Payer: Medicare Other | Admitting: Internal Medicine

## 2022-07-28 ENCOUNTER — Ambulatory Visit: Payer: Medicare Other | Admitting: Internal Medicine

## 2022-10-14 ENCOUNTER — Telehealth: Payer: Self-pay | Admitting: Internal Medicine

## 2022-10-14 NOTE — Telephone Encounter (Signed)
Contacted Alyssa Murray to schedule their annual wellness visit. Patient declined to schedule AWV at this time. Patient stated she moved to [place]. No pcp yet  [name]; Care Guide Ambulatory Clinical Support [place] [facility] Direct Dial: [phone]

## 2023-05-11 ENCOUNTER — Encounter: Payer: Self-pay | Admitting: Internal Medicine

## 2023-05-11 NOTE — Telephone Encounter (Signed)
 Care team updated and letter sent for eye exam notes.

## 2023-05-11 NOTE — Telephone Encounter (Signed)
Please indicate if patient has or does not have diabetic retinopathy
